# Patient Record
Sex: Female | Born: 1990
Health system: Southern US, Community
[De-identification: ages and names within clinical notes are randomized; demographics above are authoritative.]

## PROBLEM LIST (undated history)

## (undated) DIAGNOSIS — IMO0001 Reserved for inherently not codable concepts without codable children: Secondary | ICD-10-CM

## (undated) DIAGNOSIS — F32A Depression, unspecified: Secondary | ICD-10-CM

## (undated) DIAGNOSIS — F988 Other specified behavioral and emotional disorders with onset usually occurring in childhood and adolescence: Secondary | ICD-10-CM

## (undated) DIAGNOSIS — F909 Attention-deficit hyperactivity disorder, unspecified type: Secondary | ICD-10-CM

---
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
## 2012-08-30 ENCOUNTER — Ambulatory Visit: Payer: Self-pay

## 2012-08-30 ENCOUNTER — Other Ambulatory Visit: Payer: Self-pay | Admitting: Occupational Medicine

## 2012-08-30 DIAGNOSIS — R7612 Nonspecific reaction to cell mediated immunity measurement of gamma interferon antigen response without active tuberculosis: Secondary | ICD-10-CM

## 2013-01-26 ENCOUNTER — Emergency Department (HOSPITAL_COMMUNITY)
Admission: EM | Admit: 2013-01-26 | Discharge: 2013-01-26 | Disposition: A | Discharge status: Home / Self Care | Payer: 59 | Source: Home / Self Care | Attending: Emergency Medicine | Admitting: Emergency Medicine

## 2013-01-26 ENCOUNTER — Encounter (HOSPITAL_COMMUNITY): Payer: Self-pay | Admitting: *Deleted

## 2013-01-26 DIAGNOSIS — K1120 Sialoadenitis, unspecified: Secondary | ICD-10-CM

## 2013-01-26 MED ORDER — AMOXICILLIN-POT CLAVULANATE 875-125 MG PO TABS: 1.0000 | ORAL_TABLET | Freq: Two times a day (BID) | ORAL | Status: DC

## 2013-01-26 NOTE — ED Notes (Signed)
Pt  Has  Red  Swollen  Area  Under  Tongue  Which  He  Noticed  This  Am    -  He  Says  The  Area  Ins  Not  painfull   -  It  Is  Slightly  reddned  However

## 2013-01-26 NOTE — ED Provider Notes (Signed)
Chief Complaint:   Chief Complaint  Patient presents with  . Oral Swelling    History of Present Illness:   Andrea Sparks is a 22 year old female who has had a swelling to the right of the frenulum below his tongue starting today. Denies any trauma to the area. It's been red and swollen. It's not painful. He has no difficulty eating or swallowing. He denies any swelling in the neck or fever.  Review of Systems:  Other than noted above, the patient denies any of the following symptoms: Systemic:  No fevers, chills, sweats, weight loss or gain, fatigue, or tiredness. Eye:  No redness, pain, discharge, itching, blurred vision, or diplopia. ENT:  No headache, nasal congestion, sneezing, itching, epistaxis, ear pain, congestion, decreased hearing, ringing in ears, vertigo, or tinnitus.  No oral lesions, sore throat, pain on swallowing, or hoarseness. Neck:  No mass, tenderness or adenopathy. Lungs:  No coughing, wheezing, or shortness of breath. Skin:  No rash or itching.  PMFSH:  Past medical history, family history, social history, meds, and allergies were reviewed.   Physical Exam:   Vital signs:  BP 127/76  Pulse 65  Temp(Src) 98.8 F (37.1 C) (Oral)  Resp 17  SpO2 97% General:  Alert and oriented.  In no distress.  Skin warm and dry. Eye:  PERRL, full EOMs, lids and conjunctiva normal.   ENT:  TMs and canals clear.  Nasal mucosa not congested and without drainage.  Mucous membranes moist, no oral lesions, normal dentition, pharynx clear.  No cranial or facial pain to palplation. Wharton's duct, below the tongue, and on the right was swollen but nontender. There was no palpable stone. Neck:  Supple, full ROM.  No adenopathy, tenderness or mass.  Thyroid normal. Lungs:  Breath sounds clear and equal bilaterally.  No wheezes, rales or rhonchi. Heart:  Rhythm regular, without extrasystoles.  No gallops or murmers. Skin:  Clear, warm and dry.  Assessment:  The encounter diagnosis was  Sialadenitis.  This could be due to just inflammation or could be a stone.  Plan:   1.  The following meds were prescribed:   Discharge Medication List as of 01/26/2013  5:14 PM    START taking these medications   Details  amoxicillin-clavulanate (AUGMENTIN) 875-125 MG per tablet Take 1 tablet by mouth 2 (two) times daily., Starting 01/26/2013, Until Discontinued, Normal       2.  The patient was instructed in symptomatic care and handouts were given. Suggested hot saline mouthwash, maintaining hydration, and sore limit drops. 3.  The patient was told to return if becoming worse in any way, if no better in 3 or 4 days, and given some red flag symptoms such as worsening of the pain that would indicate earlier return. 4.  Follow up with Dr. Suszanne Conners if no better in one week.    Reuben Likes, MD 01/26/13 [phone]

## 2016-05-20 ENCOUNTER — Ambulatory Visit (INDEPENDENT_AMBULATORY_CARE_PROVIDER_SITE_OTHER): Payer: BLUE CROSS/BLUE SHIELD | Admitting: Family Medicine

## 2016-05-20 VITALS — BP 138/84 | HR 100 | Temp 99.0°F | Resp 16 | Ht 73.0 in | Wt 176.8 lb

## 2016-05-20 DIAGNOSIS — J069 Acute upper respiratory infection, unspecified: Secondary | ICD-10-CM

## 2016-05-20 NOTE — Progress Notes (Signed)
  Chief Complaint  Patient presents with  . Cough    x 3 days  . Chills  . Sore Throat    HPI Pt reports that since 05/16/16 he has been having sore throat, chills and cough for 3 days He states that his cough is painful and nonproductive He has nasal congestion He denies runny nose, dizziness, nausea or vomiting. He states that his boss had a bacterial infection.   No past medical history on file.  No current outpatient prescriptions on file.   No current facility-administered medications for this visit.     Allergies: No Known Allergies  No past surgical history on file.  Social History   Social History  . Marital status: Single    Spouse name: N/A  . Number of children: N/A  . Years of education: N/A   Social History Main Topics  . Smoking status: Never Smoker  . Smokeless tobacco: None  . Alcohol use No  . Drug use: Unknown  . Sexual activity: Not Asked   Other Topics Concern  . None   Social History Narrative  . None    ROS  Objective: Vitals:   05/20/16 1118  BP: 138/84  Pulse: 100  Resp: 16  Temp: 99 F (37.2 C)  TempSrc: Oral  SpO2: 99%  Weight: 176 lb 12.8 oz (80.2 kg)  Height: 6\' 1"  (1.854 m)    Physical Exam General: alert, oriented, in NAD Head: normocephalic, atraumatic, no sinus tenderness Eyes: EOM intact, no scleral icterus or conjunctival injection Ears: TM clear bilaterally Throat: no pharyngeal exudate or erythema Lymph: no posterior auricular, submental or cervical lymph adenopathy Heart: normal rate, normal sinus rhythm, no murmurs Lungs: clear to auscultation bilaterally, no wheezing   Assessment and Plan Casimiro NeedleMichael was seen today for cough, chills and sore throat.  Diagnoses and all orders for this visit:  Acute URI- supportive care with lozenges, hydration and antihistamine     Zoe A Creta LevinStallings

## 2016-05-20 NOTE — Patient Instructions (Addendum)
   IF you received an x-ray today, you will receive an invoice from [facility]. Please contact [facility] at [phone] with questions or concerns regarding your invoice.   IF you received labwork today, you will receive an invoice from [facility]. Please contact [facility] at [phone] with questions or concerns regarding your invoice.   Our billing staff will not be able to assist you with questions regarding bills from these companies.  You will be contacted with the lab results as soon as they are available. The fastest way to get your results is to activate your My Chart account. Instructions are located on the last page of this paperwork. If you have not heard from us regarding the results in 2 weeks, please contact this office.     Viral Respiratory Infection A respiratory infection is an illness that affects part of the respiratory system, such as the lungs, nose, or throat. Most respiratory infections are caused by either viruses or bacteria. A respiratory infection that is caused by a virus is called a viral respiratory infection. Common types of viral respiratory infections include:  A cold.  The flu (influenza).  A respiratory syncytial virus (RSV) infection.  How do I know if I have a viral respiratory infection? Most viral respiratory infections cause:  A stuffy or runny nose.  Yellow or green nasal discharge.  A cough.  Sneezing.  Fatigue.  Achy muscles.  A sore throat.  Sweating or chills.  A fever.  A headache.  How are viral respiratory infections treated? If influenza is diagnosed early, it may be treated with an antiviral medicine that shortens the length of time a person has symptoms. Symptoms of viral respiratory infections may be treated with over-the-counter and prescription medicines, such as:  Expectorants. These make it easier to cough up mucus.  Decongestant nasal sprays.  Health care providers do not prescribe  antibiotic medicines for viral infections. This is because antibiotics are designed to kill bacteria. They have no effect on viruses. How do I know if I should stay home from work or school? To avoid exposing others to your respiratory infection, stay home if you have:  A fever.  A persistent cough.  A sore throat.  A runny nose.  Sneezing.  Muscles aches.  Headaches.  Fatigue.  Weakness.  Chills.  Sweating.  Nausea.  Follow these instructions at home:  Rest as much as possible.  Take over-the-counter and prescription medicines only as told by your health care provider.  Drink enough fluid to keep your urine clear or pale yellow. This helps prevent dehydration and helps loosen up mucus.  Gargle with a salt-water mixture 3-4 times per day or as needed. To make a salt-water mixture, completely dissolve -1 tsp of salt in 1 cup of warm water.  Use nose drops made from salt water to ease congestion and soften raw skin around your nose.  Do not drink alcohol.  Do not use tobacco products, including cigarettes, chewing tobacco, and e-cigarettes. If you need help quitting, ask your health care provider. Contact a health care provider if:  Your symptoms last for 10 days or longer.  Your symptoms get worse over time.  You have a fever.  You have severe sinus pain in your face or forehead.  The glands in your jaw or neck become very swollen. Get help right away if:  You feel pain or pressure in your chest.  You have shortness of breath.  You faint or feel like you   will faint.  You have severe and persistent vomiting.  You feel confused or disoriented. This information is not intended to replace advice given to you by your health care provider. Make sure you discuss any questions you have with your health care provider. Document Released: 02/19/2005 Document Revised: 10/18/2015 Document Reviewed: 10/18/2014 Elsevier Interactive Patient Education  2017 Elsevier  Inc.  

## 2017-05-05 DIAGNOSIS — F649 Gender identity disorder, unspecified: Secondary | ICD-10-CM | POA: Insufficient documentation

## 2017-05-05 DIAGNOSIS — E349 Endocrine disorder, unspecified: Secondary | ICD-10-CM | POA: Insufficient documentation

## 2017-07-10 ENCOUNTER — Ambulatory Visit (HOSPITAL_COMMUNITY): Admission: EM | Admit: 2017-07-10 | Discharge: 2017-07-10 | Discharge status: Against Medical Advice | Payer: 59

## 2017-07-10 NOTE — ED Notes (Signed)
Per pt access pt left 

## 2017-09-08 DIAGNOSIS — F4322 Adjustment disorder with anxiety: Secondary | ICD-10-CM | POA: Diagnosis not present

## 2017-09-22 DIAGNOSIS — F4322 Adjustment disorder with anxiety: Secondary | ICD-10-CM | POA: Diagnosis not present

## 2017-11-10 DIAGNOSIS — F4322 Adjustment disorder with anxiety: Secondary | ICD-10-CM | POA: Diagnosis not present

## 2017-12-11 DIAGNOSIS — E349 Endocrine disorder, unspecified: Secondary | ICD-10-CM | POA: Diagnosis not present

## 2017-12-11 DIAGNOSIS — F649 Gender identity disorder, unspecified: Secondary | ICD-10-CM | POA: Diagnosis not present

## 2017-12-16 DIAGNOSIS — F4322 Adjustment disorder with anxiety: Secondary | ICD-10-CM | POA: Diagnosis not present

## 2017-12-29 DIAGNOSIS — F4322 Adjustment disorder with anxiety: Secondary | ICD-10-CM | POA: Diagnosis not present

## 2018-01-28 DIAGNOSIS — F4322 Adjustment disorder with anxiety: Secondary | ICD-10-CM | POA: Diagnosis not present

## 2018-03-12 DIAGNOSIS — F902 Attention-deficit hyperactivity disorder, combined type: Secondary | ICD-10-CM | POA: Diagnosis not present

## 2018-04-09 DIAGNOSIS — F902 Attention-deficit hyperactivity disorder, combined type: Secondary | ICD-10-CM | POA: Diagnosis not present

## 2018-04-13 DIAGNOSIS — E349 Endocrine disorder, unspecified: Secondary | ICD-10-CM | POA: Diagnosis not present

## 2018-04-13 DIAGNOSIS — F649 Gender identity disorder, unspecified: Secondary | ICD-10-CM | POA: Diagnosis not present

## 2018-04-13 DIAGNOSIS — Z23 Encounter for immunization: Secondary | ICD-10-CM | POA: Diagnosis not present

## 2018-04-30 DIAGNOSIS — F902 Attention-deficit hyperactivity disorder, combined type: Secondary | ICD-10-CM | POA: Diagnosis not present

## 2018-06-11 DIAGNOSIS — Z202 Contact with and (suspected) exposure to infections with a predominantly sexual mode of transmission: Secondary | ICD-10-CM | POA: Diagnosis not present

## 2018-06-11 DIAGNOSIS — Z23 Encounter for immunization: Secondary | ICD-10-CM | POA: Diagnosis not present

## 2019-04-29 DIAGNOSIS — F4322 Adjustment disorder with anxiety: Secondary | ICD-10-CM | POA: Diagnosis not present

## 2019-05-06 ENCOUNTER — Other Ambulatory Visit: Payer: Self-pay

## 2019-05-06 ENCOUNTER — Telehealth (INDEPENDENT_AMBULATORY_CARE_PROVIDER_SITE_OTHER): Payer: BC Managed Care – PPO | Admitting: Family Medicine

## 2019-05-06 DIAGNOSIS — K0889 Other specified disorders of teeth and supporting structures: Secondary | ICD-10-CM | POA: Diagnosis not present

## 2019-05-06 DIAGNOSIS — J029 Acute pharyngitis, unspecified: Secondary | ICD-10-CM | POA: Diagnosis not present

## 2019-05-06 MED ORDER — AMOXICILLIN 500 MG PO CAPS: 500.0000 mg | ORAL_CAPSULE | Freq: Three times a day (TID) | ORAL | 0 refills | Status: DC

## 2019-05-06 NOTE — Progress Notes (Signed)
   Virtual Visit Note  I connected with patient on 05/06/19 at 556pm by phone and verified that I am speaking with the correct person using two identifiers. Andrea Sparks is currently located at home and patient is currently with them during visit. The provider, Rutherford Guys, MD is located in their office at time of visit.  I discussed the limitations, risks, security and privacy concerns of performing an evaluation and management service by telephone and the availability of in person appointments. I also discussed with the patient that there may be a patient responsible charge related to this service. The patient expressed understanding and agreed to proceed.   CC: sore throat  HPI ? Feels that he has a small bump on right tonsil Hurts when he swallows In the past had similar sx preceded fevers and infection When he looks in the mirror tonsil is not red or swollen Has mild cervical LAD No ear pain Has a right lower broken molar - has been achy on and off for the past, waiting for dental insurance to start next week   No Known Allergies  Prior to Admission medications   Medication Sig Start Date End Date Taking? Authorizing Provider  buPROPion (WELLBUTRIN XL) 150 MG 24 hr tablet TAKE 1 TABLET BY MOUTH ONCE DAILY IN THE MORNING 06/08/18  Yes [provider]  estradiol valerate (DELESTROGEN) 20 MG/ML injection Inject into the muscle. 08/03/18 08/03/19 Yes [provider]  methylphenidate (RITALIN) 10 MG tablet TAKE 1 TABLET BY MOUTH TWICE DAILY 06/04/18  Yes [provider]  progesterone (PROMETRIUM) 100 MG capsule Take by mouth. 08/03/18 08/03/19 Yes [provider]    No past medical history on file.  No past surgical history on file.  Social History   Tobacco Use  . Smoking status: Never Smoker  Substance Use Topics  . Alcohol use: No    No family history on file.  ROS  Per hpi  Objective  Vitals as reported by the patient:  none   ASSESSMENT and PLAN  1. Sore throat 2. Pain, dental  Other orders - amoxicillin (AMOXIL) 500 MG capsule; Take 1 capsule (500 mg total) by mouth 3 (three) times daily.  FOLLOW-UP: prn   The above assessment and management plan was discussed with the patient. The patient verbalized understanding of and has agreed to the management plan. Patient is aware to call the clinic if symptoms persist or worsen. Patient is aware when to return to the clinic for a follow-up visit. Patient educated on when it is appropriate to go to the emergency department.    I provided 10 minutes of non-face-to-face time during this encounter.  Rutherford Guys, MD [facility] [place], [place] [address] Ph.  [phone] Fax [phone]

## 2019-05-06 NOTE — Progress Notes (Signed)
Pt says that her tonsil on the right side is swollen. Says there is a tickley feeling when swallowing. Feels there is a 2-3cm lump. Onset 24 hours ago, not using anything for the comfort of the throat. Says same thing happened in Feb accompanied with fever

## 2019-05-27 HISTORY — PX: MULTIPLE TOOTH EXTRACTIONS: SHX2053

## 2019-05-28 DIAGNOSIS — F4322 Adjustment disorder with anxiety: Secondary | ICD-10-CM | POA: Diagnosis not present

## 2019-06-11 DIAGNOSIS — F4322 Adjustment disorder with anxiety: Secondary | ICD-10-CM | POA: Diagnosis not present

## 2019-07-12 ENCOUNTER — Ambulatory Visit (INDEPENDENT_AMBULATORY_CARE_PROVIDER_SITE_OTHER): Payer: BC Managed Care – PPO

## 2019-07-12 ENCOUNTER — Ambulatory Visit (INDEPENDENT_AMBULATORY_CARE_PROVIDER_SITE_OTHER): Payer: BC Managed Care – PPO | Admitting: Registered Nurse

## 2019-07-12 ENCOUNTER — Encounter: Payer: Self-pay | Admitting: Registered Nurse

## 2019-07-12 ENCOUNTER — Other Ambulatory Visit: Payer: Self-pay

## 2019-07-12 VITALS — BP 137/85 | HR 84 | Temp 98.1°F | Ht 71.0 in | Wt 183.4 lb

## 2019-07-12 DIAGNOSIS — Z23 Encounter for immunization: Secondary | ICD-10-CM

## 2019-07-12 DIAGNOSIS — M79642 Pain in left hand: Secondary | ICD-10-CM | POA: Diagnosis not present

## 2019-07-12 DIAGNOSIS — M79645 Pain in left finger(s): Secondary | ICD-10-CM

## 2019-07-12 MED ORDER — DOXYCYCLINE HYCLATE 100 MG PO TABS: 100.0000 mg | ORAL_TABLET | Freq: Two times a day (BID) | ORAL | 0 refills | Status: DC

## 2019-07-12 NOTE — Progress Notes (Signed)
Established Patient Office Visit  Subjective:  Patient ID: Andrea Sparks, female    DOB: 1991/05/17  Age: 29 y.o. MRN: [id number]  CC:  Chief Complaint  Patient presents with  . Hand Pain    patient states he has pain in the right pointer finger. per patient he states it feels like he just got his finger pricked and shes it keeps getting worse . Hurts to touch and its throbbing.    HPI Andrea Sparks presents for pain on finger. Noticed either yesterday or two days ago - did not think much of it. Woke up today, finger is red, extremely tender. Second digit on L hand. No known injury. Uses this finger frequently for typing and work, but has not had this issue before.   Of note, pt has microchips implanted in the webbing between the first and second digits of each hand. Inserted around 4 months ago without incident. Incisions appear to be healing well.   No past medical history on file.  No past surgical history on file.  No family history on file.  Social History   Socioeconomic History  . Marital status: Single    Spouse name: Not on file  . Number of children: Not on file  . Years of education: Not on file  . Highest education level: Not on file  Occupational History  . Not on file  Tobacco Use  . Smoking status: Never Smoker  . Smokeless tobacco: Never Used  Substance and Sexual Activity  . Alcohol use: No  . Drug use: Never  . Sexual activity: Yes  Other Topics Concern  . Not on file  Social History Narrative  . Not on file   Social Determinants of Health   Financial Resource Strain:   . Difficulty of Paying Living Expenses: Not on file  Food Insecurity:   . Worried About Charity fundraiser in the Last Year: Not on file  . Ran Out of Food in the Last Year: Not on file  Transportation Needs:   . Lack of Transportation (Medical): Not on file  . Lack of Transportation (Non-Medical): Not on file  Physical Activity:   . Days of Exercise per Week: Not on  file  . Minutes of Exercise per Session: Not on file  Stress:   . Feeling of Stress : Not on file  Social Connections:   . Frequency of Communication with Friends and Family: Not on file  . Frequency of Social Gatherings with Friends and Family: Not on file  . Attends Religious Services: Not on file  . Active Member of Clubs or Organizations: Not on file  . Attends Archivist Meetings: Not on file  . Marital Status: Not on file  Intimate Partner Violence:   . Fear of Current or Ex-Partner: Not on file  . Emotionally Abused: Not on file  . Physically Abused: Not on file  . Sexually Abused: Not on file    Outpatient Medications Prior to Visit  Medication Sig Dispense Refill  . buPROPion (WELLBUTRIN XL) 150 MG 24 hr tablet TAKE 1 TABLET BY MOUTH ONCE DAILY IN THE MORNING    . estradiol valerate (DELESTROGEN) 20 MG/ML injection Inject into the muscle.    . methylphenidate (RITALIN) 10 MG tablet TAKE 1 TABLET BY MOUTH TWICE DAILY    . progesterone (PROMETRIUM) 100 MG capsule Take by mouth.    Marland Kitchen amoxicillin (AMOXIL) 500 MG capsule Take 1 capsule (500 mg total) by mouth 3 (  three) times daily. (Patient not taking: Reported on 07/12/2019) 30 capsule 0   No facility-administered medications prior to visit.    No Known Allergies  ROS Review of Systems  Constitutional: Negative.   HENT: Negative.   Eyes: Negative.   Respiratory: Negative.   Cardiovascular: Negative.   Gastrointestinal: Negative.   Endocrine: Negative.   Genitourinary: Negative.   Musculoskeletal: Negative.   Skin: Negative.   Allergic/Immunologic: Negative.   Neurological: Negative.   Hematological: Negative.   Psychiatric/Behavioral: Negative.   All other systems reviewed and are negative.     Objective:    Physical Exam  Constitutional: She is oriented to person, place, and time. She appears well-developed and well-nourished. No distress.  HENT:  Head: Normocephalic and atraumatic.  Right Ear:  External ear normal.  Left Ear: External ear normal.  Mouth/Throat: No oropharyngeal exudate.  Eyes: Conjunctivae are normal.  Cardiovascular: Normal rate and regular rhythm.  Neurological: She is alert and oriented to person, place, and time. No cranial nerve deficit. She exhibits normal muscle tone. Coordination normal.  Skin: Skin is warm and dry. No rash noted. She is not diaphoretic. There is erythema. No pallor.     Psychiatric: She has a normal mood and affect. Her behavior is normal. Judgment and thought content normal.  Nursing note and vitals reviewed.   BP 137/85   Pulse 84   Temp 98.1 F (36.7 C) (Temporal)   Ht 5' 11"  (1.803 m)   Wt 183 lb 6.4 oz (83.2 kg)   SpO2 98%   BMI 25.58 kg/m  Wt Readings from Last 3 Encounters:  07/12/19 183 lb 6.4 oz (83.2 kg)  05/20/16 176 lb 12.8 oz (80.2 kg)     Health Maintenance Due  Topic Date Due  . HIV Screening  05/17/2006  . PAP-Cervical Cytology Screening  05/17/2012  . PAP SMEAR-Modifier  05/17/2012  . INFLUENZA VACCINE  12/25/2018    There are no preventive care reminders to display for this patient.  No results found for: TSH No results found for: WBC, HGB, HCT, MCV, PLT No results found for: NA, K, CHLORIDE, CO2, GLUCOSE, BUN, CREATININE, BILITOT, ALKPHOS, AST, ALT, PROT, ALBUMIN, CALCIUM, ANIONGAP, EGFR, GFR No results found for: CHOL No results found for: HDL No results found for: LDLCALC No results found for: TRIG No results found for: CHOLHDL No results found for: HGBA1C    Assessment & Plan:   Problem List Items Addressed This Visit    None    Visit Diagnoses    Need for prophylactic vaccination and inoculation against influenza    -  Primary   Relevant Orders   Flu Vaccine QUAD 36+ mos IM   Finger pain, left       Relevant Medications   doxycycline (VIBRA-TABS) 100 MG tablet   Other Relevant Orders   DG Hand Complete Left (Completed)   Ambulatory referral to Hand Surgery      Meds ordered  this encounter  Medications  . doxycycline (VIBRA-TABS) 100 MG tablet    Sig: Take 1 tablet (100 mg total) by mouth 2 (two) times daily.    Dispense:  10 tablet    Refill:  0    Order Specific Question:   Supervising Provider    Answer:   Forrest Moron [id number]    Follow-up: No follow-ups on file.   PLAN  No sign of foreign body in finger on xray, but suspect sliver of unknown origin providing irritation. Suggest soaks, OTC analgesic,  short course of abx to prevent infection.  Microchips noted on xray likely not related to patient's symptoms, but will refer to hand surgeon for further assessment if symptoms fail to resolve  Patient encouraged to call clinic with any questions, comments, or concerns.  Maximiano Coss, NP

## 2019-07-12 NOTE — Patient Instructions (Signed)
° ° ° °  If you have lab work done today you will be contacted with your lab results within the next 2 weeks.  If you have not heard from us then please contact us. The fastest way to get your results is to register for My Chart. ° ° °IF you received an x-ray today, you will receive an invoice from [facility]. Please contact [facility] at [phone] with questions or concerns regarding your invoice.  ° °IF you received labwork today, you will receive an invoice from [facility]. Please contact [facility] at [phone] with questions or concerns regarding your invoice.  ° °Our billing staff will not be able to assist you with questions regarding bills from these companies. ° °You will be contacted with the lab results as soon as they are available. The fastest way to get your results is to activate your My Chart account. Instructions are located on the last page of this paperwork. If you have not heard from us regarding the results in 2 weeks, please contact this office. °  ° ° ° °

## 2019-07-15 DIAGNOSIS — F4322 Adjustment disorder with anxiety: Secondary | ICD-10-CM | POA: Diagnosis not present

## 2019-07-29 DIAGNOSIS — F4322 Adjustment disorder with anxiety: Secondary | ICD-10-CM | POA: Diagnosis not present

## 2019-08-05 ENCOUNTER — Encounter: Payer: Self-pay | Admitting: Registered Nurse

## 2019-08-05 ENCOUNTER — Other Ambulatory Visit: Payer: Self-pay

## 2019-08-05 ENCOUNTER — Ambulatory Visit (INDEPENDENT_AMBULATORY_CARE_PROVIDER_SITE_OTHER): Payer: BC Managed Care – PPO | Admitting: Registered Nurse

## 2019-08-05 VITALS — BP 141/85 | HR 83 | Temp 98.0°F | Ht 71.0 in | Wt 184.2 lb

## 2019-08-05 DIAGNOSIS — Z13228 Encounter for screening for other metabolic disorders: Secondary | ICD-10-CM

## 2019-08-05 DIAGNOSIS — Z1329 Encounter for screening for other suspected endocrine disorder: Secondary | ICD-10-CM | POA: Diagnosis not present

## 2019-08-05 DIAGNOSIS — Z13220 Encounter for screening for lipoid disorders: Secondary | ICD-10-CM

## 2019-08-05 DIAGNOSIS — Z0000 Encounter for general adult medical examination without abnormal findings: Secondary | ICD-10-CM | POA: Diagnosis not present

## 2019-08-05 DIAGNOSIS — Z130 Encounter for screening for diseases of the blood and blood-forming organs and certain disorders involving the immune mechanism: Secondary | ICD-10-CM

## 2019-08-05 DIAGNOSIS — F640 Transsexualism: Secondary | ICD-10-CM | POA: Diagnosis not present

## 2019-08-05 DIAGNOSIS — Z789 Other specified health status: Secondary | ICD-10-CM

## 2019-08-05 NOTE — Patient Instructions (Signed)
° ° ° °  If you have lab work done today you will be contacted with your lab results within the next 2 weeks.  If you have not heard from us then please contact us. The fastest way to get your results is to register for My Chart. ° ° °IF you received an x-ray today, you will receive an invoice from [facility]. Please contact  Radiology at [phone] with questions or concerns regarding your invoice.  ° °IF you received labwork today, you will receive an invoice from [facility]. Please contact [facility] at [phone] with questions or concerns regarding your invoice.  ° °Our billing staff will not be able to assist you with questions regarding bills from these companies. ° °You will be contacted with the lab results as soon as they are available. The fastest way to get your results is to activate your My Chart account. Instructions are located on the last page of this paperwork. If you have not heard from us regarding the results in 2 weeks, please contact this office. °  ° ° ° °

## 2019-08-06 LAB — LIPID PANEL
Chol/HDL Ratio: 3.3 ratio (ref 0.0–4.4)
Cholesterol, Total: 156 mg/dL (ref 100–199)
HDL: 47 mg/dL (ref >39)
LDL Chol Calc (NIH): 88 mg/dL (ref 0–99)
Triglycerides: 117 mg/dL (ref 0–149)
VLDL Cholesterol Cal: 21 mg/dL (ref 5–40)

## 2019-08-06 LAB — COMPREHENSIVE METABOLIC PANEL
ALT: 16 IU/L (ref 0–32)
AST: 18 IU/L (ref 0–40)
Albumin/Globulin Ratio: 1.7 (ref 1.2–2.2)
Albumin: 4.7 g/dL (ref 3.9–5.0)
Alkaline Phosphatase: 34 IU/L — ABNORMAL LOW (ref 39–117)
BUN/Creatinine Ratio: 13 (ref 9–23)
BUN: 10 mg/dL (ref 6–20)
Bilirubin Total: 0.3 mg/dL (ref 0.0–1.2)
CO2: 20 mmol/L (ref 20–29)
Calcium: 9.5 mg/dL (ref 8.7–10.2)
Chloride: 105 mmol/L (ref 96–106)
Creatinine, Ser: 0.78 mg/dL (ref 0.57–1.00)
GFR calc Af Amer: 120 mL/min/{1.73_m2} (ref >59)
GFR calc non Af Amer: 104 mL/min/{1.73_m2} (ref >59)
Globulin, Total: 2.7 g/dL (ref 1.5–4.5)
Glucose: 82 mg/dL (ref 65–99)
Potassium: 4.1 mmol/L (ref 3.5–5.2)
Sodium: 143 mmol/L (ref 134–144)
Total Protein: 7.4 g/dL (ref 6.0–8.5)

## 2019-08-06 LAB — CBC WITH DIFFERENTIAL/PLATELET
Basophils Absolute: 0.1 10*3/uL (ref 0.0–0.2)
Basos: 1 %
EOS (ABSOLUTE): 0.1 10*3/uL (ref 0.0–0.4)
Eos: 2 %
Hematocrit: 40.2 % (ref 34.0–46.6)
Hemoglobin: 13.9 g/dL (ref 11.1–15.9)
Immature Grans (Abs): 0 10*3/uL (ref 0.0–0.1)
Immature Granulocytes: 0 %
Lymphocytes Absolute: 2.2 10*3/uL (ref 0.7–3.1)
Lymphs: 30 %
MCH: 29.8 pg (ref 26.6–33.0)
MCHC: 34.6 g/dL (ref 31.5–35.7)
MCV: 86 fL (ref 79–97)
Monocytes Absolute: 0.7 10*3/uL (ref 0.1–0.9)
Monocytes: 10 %
Neutrophils Absolute: 4.1 10*3/uL (ref 1.4–7.0)
Neutrophils: 57 %
Platelets: 292 10*3/uL (ref 150–450)
RBC: 4.67 x10E6/uL (ref 3.77–5.28)
RDW: 11.6 % — ABNORMAL LOW (ref 11.7–15.4)
WBC: 7.2 10*3/uL (ref 3.4–10.8)

## 2019-08-06 LAB — HEMOGLOBIN A1C
Est. average glucose Bld gHb Est-mCnc: 103 mg/dL
Hgb A1c MFr Bld: 5.2 % (ref 4.8–5.6)

## 2019-08-06 LAB — ESTRADIOL: Estradiol: 282 pg/mL

## 2019-08-06 LAB — TSH: TSH: 2.8 u[IU]/mL (ref 0.450–4.500)

## 2019-08-08 ENCOUNTER — Encounter: Payer: Self-pay | Admitting: Registered Nurse

## 2019-08-11 LAB — TESTOSTERONE: Testosterone: 19 ng/dL (ref 8–48)

## 2019-08-11 LAB — ESTROGENS, TOTAL

## 2019-08-12 DIAGNOSIS — F4322 Adjustment disorder with anxiety: Secondary | ICD-10-CM | POA: Diagnosis not present

## 2019-08-14 ENCOUNTER — Encounter: Payer: Self-pay | Admitting: Registered Nurse

## 2019-08-14 NOTE — Progress Notes (Signed)
Established Patient Office Visit  Subjective:  Patient ID: Andrea Sparks, female    DOB: [birth year]  Age: 29 y.o. MRN: [id number]  CC:  Chief Complaint  Patient presents with  . Transitions Of Care    establish care. physical    HPI Andrea Sparks presents for CPE and labs. On HRT, requesting hormone labs  No concerns at this time - finger pain from past visit resolved without issue.   HRT managed by provider in [facility] - really likes the provider, does not want to change. Given Dr. Raul Del information in case.  No past medical history on file.  No past surgical history on file.  No family history on file.  Social History   Socioeconomic History  . Marital status: Single    Spouse name: Not on file  . Number of children: Not on file  . Years of education: Not on file  . Highest education level: Not on file  Occupational History  . Not on file  Tobacco Use  . Smoking status: Never Smoker  . Smokeless tobacco: Never Used  Substance and Sexual Activity  . Alcohol use: No  . Drug use: Never  . Sexual activity: Yes  Other Topics Concern  . Not on file  Social History Narrative  . Not on file   Social Determinants of Health   Financial Resource Strain:   . Difficulty of Paying Living Expenses:   Food Insecurity:   . Worried About Charity fundraiser in the Last Year:   . Arboriculturist in the Last Year:   Transportation Needs:   . Film/video editor (Medical):   Marland Kitchen Lack of Transportation (Non-Medical):   Physical Activity:   . Days of Exercise per Week:   . Minutes of Exercise per Session:   Stress:   . Feeling of Stress :   Social Connections:   . Frequency of Communication with Friends and Family:   . Frequency of Social Gatherings with Friends and Family:   . Attends Religious Services:   . Active Member of Clubs or Organizations:   . Attends Archivist Meetings:   Marland Kitchen Marital Status:   Intimate Partner Violence:   . Fear of  Current or Ex-Partner:   . Emotionally Abused:   Marland Kitchen Physically Abused:   . Sexually Abused:     Outpatient Medications Prior to Visit  Medication Sig Dispense Refill  . buPROPion (WELLBUTRIN XL) 150 MG 24 hr tablet TAKE 1 TABLET BY MOUTH ONCE DAILY IN THE MORNING    . methylphenidate (RITALIN) 10 MG tablet TAKE 1 TABLET BY MOUTH TWICE DAILY    . amoxicillin (AMOXIL) 500 MG capsule Take 1 capsule (500 mg total) by mouth 3 (three) times daily. (Patient not taking: Reported on 07/12/2019) 30 capsule 0  . doxycycline (VIBRA-TABS) 100 MG tablet Take 1 tablet (100 mg total) by mouth 2 (two) times daily. (Patient not taking: Reported on 08/05/2019) 10 tablet 0  . estradiol valerate (DELESTROGEN) 20 MG/ML injection Inject into the muscle.    . progesterone (PROMETRIUM) 100 MG capsule Take by mouth.     No facility-administered medications prior to visit.    No Known Allergies  ROS Review of Systems  Constitutional: Negative.   HENT: Negative.   Eyes: Negative.   Respiratory: Negative.   Cardiovascular: Negative.   Gastrointestinal: Negative.   Endocrine: Negative.   Genitourinary: Negative.   Musculoskeletal: Negative.   Skin: Negative.   Allergic/Immunologic: Negative.  Neurological: Negative.   Hematological: Negative.   Psychiatric/Behavioral: Negative.   All other systems reviewed and are negative.     Objective:    Physical Exam  Constitutional: She is oriented to person, place, and time. She appears well-developed and well-nourished. No distress.  HENT:  Head: Normocephalic and atraumatic.  Right Ear: External ear normal.  Left Ear: External ear normal.  Nose: Nose normal.  Mouth/Throat: Oropharynx is clear and moist. No oropharyngeal exudate.  Eyes: Pupils are equal, round, and reactive to light. Conjunctivae and EOM are normal. Right eye exhibits no discharge. Left eye exhibits no discharge. No scleral icterus.  Neck: No tracheal deviation present. No thyromegaly  present.  Cardiovascular: Normal rate, regular rhythm, normal heart sounds and intact distal pulses. Exam reveals no gallop and no friction rub.  No murmur heard. Pulmonary/Chest: Effort normal and breath sounds normal. No respiratory distress. She has no wheezes. She has no rales. She exhibits no tenderness.  Abdominal: Soft. Bowel sounds are normal. She exhibits no distension and no mass. There is no abdominal tenderness. There is no rebound and no guarding.  Musculoskeletal:        General: No tenderness, deformity or edema. Normal range of motion.     Cervical back: Normal range of motion and neck supple.  Lymphadenopathy:    She has no cervical adenopathy.  Neurological: She is alert and oriented to person, place, and time. No cranial nerve deficit. She exhibits normal muscle tone. Coordination normal.  Skin: Skin is warm. No rash noted. She is not diaphoretic. No erythema. No pallor.  Psychiatric: She has a normal mood and affect. Her behavior is normal. Judgment and thought content normal.  Nursing note and vitals reviewed.   BP (!) 141/85   Pulse 83   Temp 98 F (36.7 C) (Temporal)   Ht 5\' 11"  (1.803 m)   Wt 184 lb 3.2 oz (83.6 kg)   SpO2 98%   BMI 25.69 kg/m  Wt Readings from Last 3 Encounters:  08/05/19 184 lb 3.2 oz (83.6 kg)  07/12/19 183 lb 6.4 oz (83.2 kg)  05/20/16 176 lb 12.8 oz (80.2 kg)     Health Maintenance Due  Topic Date Due  . PAP-Cervical Cytology Screening  Never done  . PAP SMEAR-Modifier  Never done    There are no preventive care reminders to display for this patient.  Lab Results  Component Value Date   TSH 2.800 08/05/2019   Lab Results  Component Value Date   WBC 7.2 08/05/2019   HGB 13.9 08/05/2019   HCT 40.2 08/05/2019   MCV 86 08/05/2019   PLT 292 08/05/2019   Lab Results  Component Value Date   NA 143 08/05/2019   K 4.1 08/05/2019   CO2 20 08/05/2019   GLUCOSE 82 08/05/2019   BUN 10 08/05/2019   CREATININE 0.78 08/05/2019    BILITOT 0.3 08/05/2019   ALKPHOS 34 (L) 08/05/2019   AST 18 08/05/2019   ALT 16 08/05/2019   PROT 7.4 08/05/2019   ALBUMIN 4.7 08/05/2019   CALCIUM 9.5 08/05/2019   Lab Results  Component Value Date   CHOL 156 08/05/2019   Lab Results  Component Value Date   HDL 47 08/05/2019   Lab Results  Component Value Date   LDLCALC 88 08/05/2019   Lab Results  Component Value Date   TRIG 117 08/05/2019   Lab Results  Component Value Date   CHOLHDL 3.3 08/05/2019   Lab Results  Component Value  Date   HGBA1C 5.2 08/05/2019      Assessment & Plan:   Problem List Items Addressed This Visit    None    Visit Diagnoses    Screening for endocrine, metabolic and immunity disorder    -  Primary   Relevant Orders   CBC with Differential (Completed)   Comprehensive metabolic panel (Completed)   Hemoglobin A1c (Completed)   TSH (Completed)   Lipid screening       Relevant Orders   Lipid Panel (Completed)   Transgender       Relevant Orders   Testosterone (Completed)   Estrogens, Total (Completed)   Estradiol (Completed)      No orders of the defined types were placed in this encounter.   Follow-up: No follow-ups on file.   PLAN  Exam unremarkable  Labs drawn - will follow up as warranted  Return to clinic in 1 year for CPE and labs  Patient encouraged to call clinic with any questions, comments, or concerns.  Janeece Agee, NP

## 2019-09-09 DIAGNOSIS — E349 Endocrine disorder, unspecified: Secondary | ICD-10-CM | POA: Diagnosis not present

## 2019-12-16 DIAGNOSIS — F4322 Adjustment disorder with anxiety: Secondary | ICD-10-CM | POA: Diagnosis not present

## 2020-01-18 ENCOUNTER — Other Ambulatory Visit: Payer: Self-pay

## 2020-01-18 ENCOUNTER — Telehealth (INDEPENDENT_AMBULATORY_CARE_PROVIDER_SITE_OTHER): Payer: BC Managed Care – PPO | Admitting: Registered Nurse

## 2020-01-18 ENCOUNTER — Encounter: Payer: Self-pay | Admitting: Registered Nurse

## 2020-01-18 DIAGNOSIS — S39012A Strain of muscle, fascia and tendon of lower back, initial encounter: Secondary | ICD-10-CM

## 2020-01-18 MED ORDER — PREDNISONE 20 MG PO TABS: 40.0000 mg | ORAL_TABLET | Freq: Every day | ORAL | 0 refills | Status: DC

## 2020-01-18 MED ORDER — DICLOFENAC SODIUM 75 MG PO TBEC: 75.0000 mg | DELAYED_RELEASE_TABLET | Freq: Two times a day (BID) | ORAL | 0 refills | Status: DC

## 2020-01-18 MED ORDER — METHOCARBAMOL 500 MG PO TABS: 500.0000 mg | ORAL_TABLET | Freq: Four times a day (QID) | ORAL | 0 refills | Status: DC

## 2020-01-18 NOTE — Progress Notes (Signed)
Telemedicine Encounter- SOAP NOTE Established Patient  This telephone encounter was conducted with the patient's (or proxy's) verbal consent via audio telecommunications: yes  Patient was instructed to have this encounter in a suitably private space; and to only have persons present to whom they give permission to participate. In addition, patient identity was confirmed by use of name plus two identifiers (DOB and address).  I discussed the limitations, risks, security and privacy concerns of performing an evaluation and management service by telephone and the availability of in person appointments. I also discussed with the patient that there may be a patient responsible charge related to this service. The patient expressed understanding and agreed to proceed.  I spent a total of 13 minutes talking with the patient or their proxy.  Patient is at home Provider is in office  Chief Complaint  Patient presents with  . Back Pain    pulled muscle/ x 5days - out of work yesterday    Subjective   Andrea Sparks is a 29 y.o. established patient. Telephone visit today for muscle strain  HPI Onset 5 days ago Strain of lower back Started on Saturday when lifting a friend Pain is worse with straight leg raise No radiation down legs No headaches, blurred vision, LOC No saddle anesthesia or incontinence Pain is largely midline, r mildly worse than L Has happened before with lifting in past.  Patient Active Problem List   Diagnosis Date Noted  . Gender dysphoria 05/05/2017  . Hormone imbalance 05/05/2017    No past medical history on file.  Current Outpatient Medications  Medication Sig Dispense Refill  . buPROPion (WELLBUTRIN XL) 150 MG 24 hr tablet TAKE 1 TABLET BY MOUTH ONCE DAILY IN THE MORNING    . estradiol valerate (DELESTROGEN) 20 MG/ML injection Inject into the muscle.    . methylphenidate (RITALIN) 10 MG tablet TAKE 1 TABLET BY MOUTH TWICE DAILY    . progesterone  (PROMETRIUM) 100 MG capsule Take by mouth.     No current facility-administered medications for this visit.    No Known Allergies  Social History   Socioeconomic History  . Marital status: Single    Spouse name: Not on file  . Number of children: Not on file  . Years of education: Not on file  . Highest education level: Not on file  Occupational History  . Not on file  Tobacco Use  . Smoking status: Never Smoker  . Smokeless tobacco: Never Used  Substance and Sexual Activity  . Alcohol use: No  . Drug use: Never  . Sexual activity: Yes  Other Topics Concern  . Not on file  Social History Narrative  . Not on file   Social Determinants of Health   Financial Resource Strain:   . Difficulty of Paying Living Expenses: Not on file  Food Insecurity:   . Worried About Programme researcher, broadcasting/film/video in the Last Year: Not on file  . Ran Out of Food in the Last Year: Not on file  Transportation Needs:   . Lack of Transportation (Medical): Not on file  . Lack of Transportation (Non-Medical): Not on file  Physical Activity:   . Days of Exercise per Week: Not on file  . Minutes of Exercise per Session: Not on file  Stress:   . Feeling of Stress : Not on file  Social Connections:   . Frequency of Communication with Friends and Family: Not on file  . Frequency of Social Gatherings with Friends  and Family: Not on file  . Attends Religious Services: Not on file  . Active Member of Clubs or Organizations: Not on file  . Attends Banker Meetings: Not on file  . Marital Status: Not on file  Intimate Partner Violence:   . Fear of Current or Ex-Partner: Not on file  . Emotionally Abused: Not on file  . Physically Abused: Not on file  . Sexually Abused: Not on file    ROS Pertinent positives and negatives per hpi  Objective   Vitals as reported by the patient: There were no vitals filed for this visit.  [name] was seen today for back pain.  Diagnoses and all orders for  this visit:  Strain of lumbar region, initial encounter -     predniSONE (DELTASONE) 20 MG tablet; Take 2 tablets (40 mg total) by mouth daily with breakfast. -     diclofenac (VOLTAREN) 75 MG EC tablet; Take 1 tablet (75 mg total) by mouth 2 (two) times daily. -     methocarbamol (ROBAXIN) 500 MG tablet; Take 1 tablet (500 mg total) by mouth 4 (four) times daily.   PLAN  Sounds as muscle strain  Prednisone burst, diclofenac, and robaxin. Discussed using these with patient, including AEs and reasons to stop  Return to clinic in 1 week if worsening or no improvement  Patient encouraged to call clinic with any questions, comments, or concerns.  I discussed the assessment and treatment plan with the patient. The patient was provided an opportunity to ask questions and all were answered. The patient agreed with the plan and demonstrated an understanding of the instructions.   The patient was advised to call back or seek an in-person evaluation if the symptoms worsen or if the condition fails to improve as anticipated.  I provided 13 minutes of non-face-to-face time during this encounter.  Janeece Agee, NP  Primary Care at [facility]

## 2020-01-18 NOTE — Patient Instructions (Signed)
° ° ° °  If you have lab work done today you will be contacted with your lab results within the next 2 weeks.  If you have not heard from us then please contact us. The fastest way to get your results is to register for My Chart. ° ° °IF you received an x-ray today, you will receive an invoice from [facility]. Please contact [facility] at [phone] with questions or concerns regarding your invoice.  ° °IF you received labwork today, you will receive an invoice from [facility]. Please contact [facility] at [phone] with questions or concerns regarding your invoice.  ° °Our billing staff will not be able to assist you with questions regarding bills from these companies. ° °You will be contacted with the lab results as soon as they are available. The fastest way to get your results is to activate your My Chart account. Instructions are located on the last page of this paperwork. If you have not heard from us regarding the results in 2 weeks, please contact this office. °  ° ° ° °

## 2020-02-17 DIAGNOSIS — S80211A Abrasion, right knee, initial encounter: Secondary | ICD-10-CM | POA: Diagnosis not present

## 2020-02-17 DIAGNOSIS — S80212A Abrasion, left knee, initial encounter: Secondary | ICD-10-CM | POA: Diagnosis not present

## 2020-02-17 DIAGNOSIS — Z23 Encounter for immunization: Secondary | ICD-10-CM | POA: Diagnosis not present

## 2020-02-17 DIAGNOSIS — R030 Elevated blood-pressure reading, without diagnosis of hypertension: Secondary | ICD-10-CM | POA: Diagnosis not present

## 2020-03-02 DIAGNOSIS — E349 Endocrine disorder, unspecified: Secondary | ICD-10-CM | POA: Diagnosis not present

## 2020-03-02 DIAGNOSIS — Z13220 Encounter for screening for lipoid disorders: Secondary | ICD-10-CM | POA: Diagnosis not present

## 2020-05-01 ENCOUNTER — Telehealth: Payer: Self-pay | Admitting: Registered Nurse

## 2020-05-01 NOTE — Telephone Encounter (Signed)
Reminder: put in orders for lab work scheduled for 1/14. CPE scheduled for 1/21.

## 2020-05-02 ENCOUNTER — Other Ambulatory Visit: Payer: Self-pay

## 2020-05-02 DIAGNOSIS — F649 Gender identity disorder, unspecified: Secondary | ICD-10-CM

## 2020-05-02 DIAGNOSIS — Z789 Other specified health status: Secondary | ICD-10-CM

## 2020-05-02 DIAGNOSIS — Z130 Encounter for screening for diseases of the blood and blood-forming organs and certain disorders involving the immune mechanism: Secondary | ICD-10-CM

## 2020-05-02 DIAGNOSIS — E349 Endocrine disorder, unspecified: Secondary | ICD-10-CM

## 2020-05-02 DIAGNOSIS — Z13220 Encounter for screening for lipoid disorders: Secondary | ICD-10-CM

## 2020-05-02 DIAGNOSIS — Z1329 Encounter for screening for other suspected endocrine disorder: Secondary | ICD-10-CM

## 2020-05-04 DIAGNOSIS — F4322 Adjustment disorder with anxiety: Secondary | ICD-10-CM | POA: Diagnosis not present

## 2020-06-07 ENCOUNTER — Telehealth: Payer: Self-pay | Admitting: Family Medicine

## 2020-06-07 ENCOUNTER — Other Ambulatory Visit: Payer: Self-pay

## 2020-06-07 ENCOUNTER — Telehealth (INDEPENDENT_AMBULATORY_CARE_PROVIDER_SITE_OTHER): Payer: BC Managed Care – PPO | Admitting: Family Medicine

## 2020-06-07 ENCOUNTER — Encounter: Payer: Self-pay | Admitting: Family Medicine

## 2020-06-07 VITALS — Temp 99.9°F

## 2020-06-07 DIAGNOSIS — U071 COVID-19: Secondary | ICD-10-CM

## 2020-06-07 NOTE — Telephone Encounter (Signed)
Pt just had appointment (Virtual) and her HR is needing a note stating that her PCP  recommend that she get tested   Any questions please reach out to patient   At [phone]

## 2020-06-07 NOTE — Progress Notes (Addendum)
Virtual Visit Note  I connected with patient on 06/07/20 at 1500 by telephone due to unable to work  Epic video visit and verified that I am speaking with the correct person using two identifiers. Andrea Sparks is currently located at home and no family members are currently with them during visit. The provider, Azalee Course Olvin Rohr, FNP is located in their office at time of visit.  I discussed the limitations, risks, security and privacy concerns of performing an evaluation and management service by telephone and the availability of in person appointments. I also discussed with the patient that there may be a patient responsible charge related to this service. The patient expressed understanding and agreed to proceed.   I provided 20 minutes of non-face-to-face time during this encounter.  Chief Complaint  Patient presents with  . Nasal Congestion    Has had 3 home tests and last one was positive, [place] surgery scheduled in [place] in 2 weeks  Wants to clear symptoms   . Sore Throat    2 days ago has had a positive covid home test   . Well Child    HPI ? 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 Authorizing Provider  buPROPion (WELLBUTRIN XL) 150 MG 24 hr tablet TAKE 1 TABLET BY MOUTH ONCE DAILY IN THE MORNING 06/08/18  Yes [provider]  estradiol valerate (DELESTROGEN) 20 MG/ML injection Inject into the muscle. 08/03/18 06/07/20 Yes [provider]  methylphenidate (RITALIN) 10 MG tablet TAKE 1 TABLET BY MOUTH TWICE DAILY 06/04/18  Yes [provider]  progesterone (PROMETRIUM) 100 MG capsule Take by mouth. 08/03/18 06/07/20 Yes [provider]    History reviewed. No pertinent past medical  history.  History reviewed. No pertinent surgical history.  Social History   Tobacco Use  . Smoking status: Never Smoker  . Smokeless tobacco: Never Used  Substance Use Topics  . Alcohol use: No    History reviewed. No pertinent family history.  Review of Systems  Constitutional: Negative for chills, fever, malaise/fatigue and weight loss.  HENT: Positive for congestion and sore throat. Negative for sinus pain.   Respiratory: Negative for cough, sputum production, shortness of breath and wheezing.   Cardiovascular: Negative for chest pain and palpitations.  Gastrointestinal: Negative for abdominal pain, blood in stool, constipation, diarrhea, heartburn, nausea and vomiting.  Musculoskeletal: Negative for back pain and myalgias.  Neurological: Negative for headaches.    Objective  Constitutional:      General: Not in acute distress.    Appearance: Normal appearance. Not ill-appearing.   Pulmonary:     Effort: Pulmonary effort is normal. No respiratory distress.  Neurological:     Mental Status: Alert and oriented to person, place, and time.  Psychiatric:        Mood and Affect: Mood normal.        Behavior: Behavior normal.     ASSESSMENT and PLAN  Problem List Items Addressed This Visit   None   Visit Diagnoses    COVID-19    -  Primary     Work note sent ED precautions provided Conservative treatment: OTC remedies   Return if symptoms worsen or fail to improve.  The above assessment and management plan was discussed with the patient. The patient verbalized understanding of and has agreed to the management plan. Patient is aware to call the clinic if symptoms persist or worsen. Patient is aware when to return to the clinic for a follow-up visit. Patient educated on when it is appropriate to go to the emergency department.     Macario Carls Marvie Brevik, FNP-BC Primary Care at [facility] [address] [place], [place] [address] Ph.  [phone] Fax [phone]  I have  reviewed and agree with above documentation. Edwina Barth, MD

## 2020-06-07 NOTE — Patient Instructions (Signed)

## 2020-06-07 NOTE — Telephone Encounter (Signed)
Done sent via MyChart

## 2020-06-08 ENCOUNTER — Telehealth: Payer: BC Managed Care – PPO

## 2020-06-08 ENCOUNTER — Encounter: Payer: Self-pay | Admitting: Otolaryngology

## 2020-06-08 ENCOUNTER — Ambulatory Visit: Payer: BC Managed Care – PPO

## 2020-06-08 NOTE — Pre-Procedure Instructions (Addendum)
Pre op exam and labs at PCP on 06/15/20, patient will request office staff fax same to [facility] [phone].  No additional testing per anesthesia guidelines.

## 2020-06-15 ENCOUNTER — Other Ambulatory Visit: Payer: BC Managed Care – PPO

## 2020-06-15 ENCOUNTER — Encounter: Payer: Self-pay | Admitting: Family Medicine

## 2020-06-15 ENCOUNTER — Encounter: Payer: BC Managed Care – PPO | Admitting: Registered Nurse

## 2020-06-15 ENCOUNTER — Other Ambulatory Visit: Payer: Self-pay

## 2020-06-15 ENCOUNTER — Ambulatory Visit (INDEPENDENT_AMBULATORY_CARE_PROVIDER_SITE_OTHER): Payer: BC Managed Care – PPO | Admitting: Family Medicine

## 2020-06-15 ENCOUNTER — Other Ambulatory Visit: Payer: BLUE CROSS/BLUE SHIELD

## 2020-06-15 VITALS — BP 124/86 | HR 97 | Temp 98.6°F | Ht 71.0 in | Wt 180.4 lb

## 2020-06-15 DIAGNOSIS — F649 Gender identity disorder, unspecified: Secondary | ICD-10-CM

## 2020-06-15 DIAGNOSIS — Z20822 Contact with and (suspected) exposure to covid-19: Secondary | ICD-10-CM | POA: Diagnosis not present

## 2020-06-15 DIAGNOSIS — Z01818 Encounter for other preprocedural examination: Secondary | ICD-10-CM

## 2020-06-15 DIAGNOSIS — F4322 Adjustment disorder with anxiety: Secondary | ICD-10-CM | POA: Diagnosis not present

## 2020-06-15 NOTE — Progress Notes (Signed)
Subjective:  Patient ID: Andrea Sparks, female    DOB: 31-Jan-1991  Age: 30 y.o. MRN: [id number]  CC:  Chief Complaint  Patient presents with   surgical clearance     Surgery on 06/20/20 in [place]. It is for FEMMINIZING FOREHEAD CONTORING W/BROW LIFT, FEMINIZING LIF LIPT (MEDICAL)   Form from pt in pt door.    HPI Andrea Sparks presents for  Preoperative history/physical form completion.  Planned surgery on January 26 in [place].  Will be undergoing feminizing forehead contouring with feminizing lip lift. - facial feminization surgery Hairline advancement, feminizing rhinoplasyty, forehead contouring, lip lift, mandible contouring and tracheal shave. Outpatient surgery.  Will be staying with friend for 10 days, who also had surgery.   Current medications include Wellbutrin, Ritalin, estradiol and progesterone.  Chart reviewed, currently treated by Harlen Labs, NP at [facility] family medicine for hormone balance, gender dysphoria.  Feminizing hormone therapy. Transgender woman, assigned female at birth.  Currently followed by Jari Sportsman for PCP.  Psychiatrist: Boyd Kerbs at [facility] in [place] area.  Ritalin for ADD on workdays, not on days off or at time of planned surgery.  Wellbutrin for depression - doing well, controlled.   Depression screen [facility] 2/9 06/15/2020 06/07/2020 08/05/2019 07/12/2019 05/06/2019  Decreased Interest 0 0 0 0 0  Down, Depressed, Hopeless 0 0 0 0 0  PHQ - 2 Score 0 0 0 0 0   No personal of family hx of early heart disease known. Anesthesia for wisdom teeth in July - no issue.  No lung disease, or OSA. No loose teeth.   Dancing without CP/dyspnea. No DOE/CP with city block.  No hx of TIA/CVA, CHF, or CKD.     History Patient Active Problem List   Diagnosis Date Noted   Gender dysphoria 05/05/2017   Hormone imbalance 05/05/2017   History reviewed. No pertinent past medical history. History reviewed.  No pertinent surgical history. No Known Allergies Prior to Admission medications   Medication Sig Start Date End Date Taking? Authorizing Provider  buPROPion (WELLBUTRIN XL) 150 MG 24 hr tablet TAKE 1 TABLET BY MOUTH ONCE DAILY IN THE MORNING 06/08/18  Yes [provider]  estradiol valerate (DELESTROGEN) 20 MG/ML injection Inject into the muscle. 08/03/18 06/15/20 Yes [provider]  methylphenidate (RITALIN) 10 MG tablet TAKE 1 TABLET BY MOUTH TWICE DAILY 06/04/18  Yes [provider]  progesterone (PROMETRIUM) 100 MG capsule Take by mouth. 08/03/18 06/15/20 Yes [provider]   Social History   Socioeconomic History   Marital status: Single    Spouse name: Not on file   Number of children: Not on file   Years of education: Not on file   Highest education level: Not on file  Occupational History   Not on file  Tobacco Use   Smoking status: Never Smoker   Smokeless tobacco: Never Used  Substance and Sexual Activity   Alcohol use: No   Drug use: Never   Sexual activity: Yes  Other Topics Concern   Not on file  Social History Narrative   Not on file   Social Determinants of Health   Financial Resource Strain: Not on file  Food Insecurity: Not on file  Transportation Needs: Not on file  Physical Activity: Not on file  Stress: Not on file  Social Connections: Not on file  Intimate Partner Violence: Not on file    Review of Systems   Objective:   Vitals:  06/15/20 1510 06/15/20 1513  BP: (!) 145/89 124/86  Pulse: 97   Temp: 98.6 F (37 C)   TempSrc: Temporal   SpO2: 97%   Weight: 180 lb 6.4 oz (81.8 kg)   Height: 5\' 11"  (1.803 m)      Physical Exam Constitutional:      Appearance: She is well-developed and well-nourished.  HENT:     Head: Normocephalic and atraumatic.     Right Ear: External ear normal.     Left Ear: External ear normal.     Mouth/Throat:     Mouth: Oropharynx is clear and moist.  Eyes:      Conjunctiva/sclera: Conjunctivae normal.     Pupils: Pupils are equal, round, and reactive to light.  Neck:     Thyroid: No thyromegaly.  Cardiovascular:     Rate and Rhythm: Normal rate and regular rhythm.     Pulses: Intact distal pulses.     Heart sounds: Normal heart sounds. No murmur heard.   Pulmonary:     Effort: Pulmonary effort is normal. No respiratory distress.     Breath sounds: Normal breath sounds. No wheezing.  Abdominal:     General: Bowel sounds are normal.     Palpations: Abdomen is soft.     Tenderness: There is no abdominal tenderness.  Musculoskeletal:        General: No tenderness or edema. Normal range of motion.     Cervical back: Normal range of motion and neck supple.  Lymphadenopathy:     Cervical: No cervical adenopathy.  Skin:    General: Skin is warm and dry.     Findings: No rash.  Neurological:     Mental Status: She is alert and oriented to person, place, and time.  Psychiatric:        Mood and Affect: Mood and affect normal.        Behavior: Behavior normal.        Thought Content: Thought content normal.      31 minutes spent during visit, greater than 50% counseling and assimilation of information, chart review, and discussion of plan.    Assessment & Plan:  Andrea Sparks is a 30 y.o. female . Preoperative evaluation to rule out surgical contraindication - Plan: PT AND PTT, CBC with Differential/Platelet  Gender dysphoria  Plan facial feminization surgery as above.  No apparent increased risk of major adverse cardiac event based on history, no concerns on physical exam or history.  Will not be on stimulant medication at time of surgery. CBC with differential, PTT, PT ordered per surgical request.  Other labs, EKG or x-ray do not appear to be necessary at this time.  Paperwork completed for 37.   No orders of the defined types were placed in this encounter.  Patient Instructions       If you have lab work done  today you will be contacted with your lab results within the next 2 weeks.  If you have not heard from Radiographer, therapeutic then please contact us. The fastest way to get your results is to register for My Chart.   IF you received an x-ray today, you will receive an invoice from [facility] Rad[facility]. Please contact [facility] Radiology at [phone] with questions or concerns regarding your invoice.   IF you received labwork today, you will receive an invoice from [place]. Please contact [facility] at [phone] with questions or concerns regarding your invoice.   Our billing staff will not be able to  assist you with questions regarding bills from these companies.  You will be contacted with the lab results as soon as they are available. The fastest way to get your results is to activate your My Chart account. Instructions are located on the last page of this paperwork. If you have not heard from [place] regarding the results in 2 weeks, please contact this office.          Signed, Merri Ray, MD [facility]

## 2020-06-15 NOTE — Patient Instructions (Addendum)
  Good luck with your upcoming surgery.  Please let me know if surgeon needs other information from our office, but blood count and PT/PTT should be available by MyChart in the next few days.   If you have lab work done today you will be contacted with your lab results within the next 2 weeks.  If you have not heard from [place] then please contact us. The fastest way to get your results is to register for My Chart.   IF you received an x-ray today, you will receive an invoice from [facility] Radiology. Please contact [facility] Radiology at [phone] with questions or concerns regarding your invoice.   IF you received labwork today, you will receive an invoice from [place]. Please contact [facility] at [phone] with questions or concerns regarding your invoice.   Our billing staff will not be able to assist you with questions regarding bills from these companies.  You will be contacted with the lab results as soon as they are available. The fastest way to get your results is to activate your My Chart account. Instructions are located on the last page of this paperwork. If you have not heard from [place] regarding the results in 2 weeks, please contact this office.

## 2020-06-16 LAB — CBC WITH DIFFERENTIAL/PLATELET
Basophils Absolute: 0.1 10*3/uL (ref 0.0–0.2)
Basos: 1 %
EOS (ABSOLUTE): 0.2 10*3/uL (ref 0.0–0.4)
Eos: 2 %
Hematocrit: 42.8 % (ref 34.0–46.6)
Hemoglobin: 14 g/dL (ref 11.1–15.9)
Immature Grans (Abs): 0.1 10*3/uL (ref 0.0–0.1)
Immature Granulocytes: 1 %
Lymphocytes Absolute: 2.6 10*3/uL (ref 0.7–3.1)
Lymphs: 26 %
MCH: 28.3 pg (ref 26.6–33.0)
MCHC: 32.7 g/dL (ref 31.5–35.7)
MCV: 87 fL (ref 79–97)
Monocytes Absolute: 0.6 10*3/uL (ref 0.1–0.9)
Monocytes: 6 %
Neutrophils Absolute: 6.3 10*3/uL (ref 1.4–7.0)
Neutrophils: 64 %
Platelets: 327 10*3/uL (ref 150–450)
RBC: 4.95 x10E6/uL (ref 3.77–5.28)
RDW: 11.6 % — ABNORMAL LOW (ref 11.7–15.4)
WBC: 9.7 10*3/uL (ref 3.4–10.8)

## 2020-06-16 LAB — PT AND PTT
INR: 0.9 (ref 0.9–1.2)
Prothrombin Time: 9.8 s (ref 9.1–12.0)
aPTT: 27 s (ref 24–33)

## 2020-06-17 LAB — NOVEL CORONAVIRUS, NAA

## 2020-06-17 LAB — SARS-COV-2, NAA 2 DAY TAT

## 2020-06-18 ENCOUNTER — Other Ambulatory Visit: Payer: Self-pay | Admitting: Otolaryngology

## 2020-06-19 NOTE — Op Note (Signed)
FULL OPERATIVE NOTE    Date Time: 06/20/20 3:49 PM  Patient Name: Hayley Sanders  Attending Physician: Jodi Geralds, MD      Date of Operation:   06/20/2020    Providers Performing:   Surgeon(s):  Jodi Geralds, MD - Attending Surgeon  Melony Overly, MD - PGY 5 Resident    Circulator: Kathleen Argue, RN  Relief Circulator: Dorene Sorrow, Juliette Alcide, RN  Relief Scrub: Cheral Bay D.; Sternagle, Elberta Fortis Person: Dalbert Batman  Second Circulator: Artis Delay    Operative Procedure:   Procedure(s):  1) Feminizing Forehead contouring with anterior table frontal sinus set back (CPT [phone], [phone])  2) Bilateral feminizing orbital rim reconstruction (CPT 21172)   3) Bone autograft to forehead (CPT 20900)  4) Bilateral feminizing forehead rhytidectomy, excision of temporal recessions (CPT 15824 - modifier 50)  5) Bilateral feminizing brow lift (CPT 67900 - modifier 50)  6) Bilateral feminizing scalp advancement Primary and Secondary Defect 16x25cm (CPT 14301, 14302 x 12 units)  7) Feminizing rhinoplasty (CPT [phone], 30410)  8) Feminizing Mandible contouring (CPT 21299, 21025, 21120)  9) Feminizing Lip Lift (CPT 40799)  10) Feminizing chondrolaryngoplasty/Tracheal Shave (CPT [phone])       Preoperative Diagnosis:   Pre-Op Diagnosis Codes:     * Transsexualism [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 Hayley, Sanders is a 30 y.o.-year-old transgender female with gender dysphoria on estrogen hormone replacement therapy. She was last seen on 06/18/20 for a surgical consultation after her initial telehealth consultation in January of 2021. Planned surgery includes:    1) Feminizing Forehead contouring with anterior table frontal sinus set back (CPT [phone], 21139)  2) Bilateral feminizing orbital rim reconstruction (CPT 21172)   3) Bone autograft to forehead (CPT 20900)  4) Bilateral feminizing forehead rhytidectomy, excision of  temporal recessions (CPT 15824 - modifier 50)  5) Bilateral feminizing brow lift (CPT 67900 - modifier 50)  6) Bilateral feminizing scalp advancement Primary and Secondary Defect  7) Feminizing rhinoplasty (CPT [phone], 30410)  8) Feminizing Mandible contouring (CPT 21299, 21025, 21120)  9) Feminizing Lip Lift (CPT [phone])  10) Feminizing chondrolaryngoplasty/Tracheal Shave (CPT [phone])       Each of these adjustments will allow for a more feminine appearance which would aid in her gender dysphoria. Procedures and expectations were reviewed again today with the patient. Postoperative instructions were discussed today and will be discussed again at her first postoperative visit.     I had a lengthy discussion with the patient regarding the benefits, alternatives, and risks of the planned facial feminization surgery including but not limited to bleeding, scarring, infection, numbness, facial weakness including weakness of raising her brows or closure of her eye, time for complete healing, early versus late appearance, facial features and asymmetries not expected to improve, failure to correct the problem and the potential need for further revision surgery, skin loss or skin necrosis. Other risks including swelling, bruising, nasal congestion, nasal stuffiness, septal perforation, numbness of the scalp, face, lips and lateral jaw, abscess or fluid collection, hematoma formation, voice changes (temporary or permanent) hypertrophic scar formation, visible scar formation, hair loss, injury to the brain, eyes, lips, nose, mouth, teeth, gums. No guarantees were made or implied. Written consent was obtained.       Operative Notes:   The patient was greeted in the pre-operative area where the patient's history,  physical and consent were reviewed. We discussed once again the anticipated incisions, expected healing and wound care obligations. The patient's pre-operative covid testing was found to be negative and as such was  appropriate to move forward with surgery. The patient was then wheeled back to the OR room and placed onto the OR bed in the supine position. A final timeout was then completed confirming the patient's name, identity and planned procedure. The patient was then induced into general anesthesia and intubated with a laryngeal mask airway by the anesthesia provider and taped to the midline upper lip in the mesentery technique. Next, the patient's arms and legs were then padded and the patient was then secured using straps and tape with the arms placed into the papoose position.    Starting with the feminizing tracheal shave/chondrolaryngoplasty, a 1.5cm incision was marked in the posterior/inferior submental region. 10cc of 1% lidocaine with epinephrine was then injected into the incision and surrounding thyroid cartilage region. The marked incision was confirmed to be able to stretch and pulled down over the prominent thyroid notch. A 15 blade was then used to incise the marked incision. Two curved hemostats and sen retractors were then used to blunted dissected down the midline raphe, splitting the strap muscles. The mid-line fascia was separated using monopolar cautery. The thyroid cartilage was then directly visualized by using monopolar cautery to identify and separate the perichondrium. Both senn retractors were then placed pulling the perichondrium away from the underlying thyroid cartilage. Next, the anesthesia provider placed a flexible fiberoptic bronchoscope through the LMA with direct visualization of bilateral true vocal folds. A 22 gauge needle on a 3cc syringe was then passed through the anterior thyroid cartilage until the needle was visualized within the upper aerodigestive tract at exactly the level of the anterior commissure. Monopolar cautery was then used to mark 1-11mm above the level of the needle along the thyroid cartilage anteriorly and also extending laterally both on the right and the left. The  needle was then removed from the neck and returned to the Glenaire stand. Next, ronguer forceps were then used to remove the thyroid notch and cartilage prominence from the marked point superiorly, making sure not to cause a thyroid cartilage fracture or to remove cartilage inferior to this point in order to protect and preserve vocal fold positioning and tension. Once the cartilage was removed, the anterior neck was visualized to exam that the prominence had been removed for an appropriate feminine appearing neck line.  The wound was inspected and found to have adequate hemostasis. The strap muscles were then reapproximated with 5-0 vicryl in a running fashion, followed by deep interrupted dermal sutures and 4-0 monocryl in a running subcuticular fashion at the level of the skin and dermabond.     Next, the LMA was then removed by the anesthesia team and the patient was intubated using a regular ET tube which was then secured to the lower lip in the mesentery technique. The patient was then turned 180 degrees. Bilateral arms and legs were then padded and the arms were then tucked into a papoose position. Foley catheter was placed.     Following repositioning the patient into the beach chair position, the forehead aspect of the procedure began. The anticipated pretrichial incision was then marked in an irregular fashion along the anterior hairline and then extending in a coronal fashion posterior to bilateral temporal tufts. The open approach columellar incision was marked for the rhinoplasty followed by a bull-horn style incision extending  in the naso-alar groove laterally only, sparing the midline lip approximately 7-77mm. Next, The temporal tuft hair was then retracted and secured using autoclave tape and the posterior hair was secured and retracted in a circumferential fashion. Bilateral eyes were then protected with lacrilube and were taped into a closed position. The patient's face and hair were then prepped using  betadine making sure to protect the patient's eyes during the application. 30cc of 1% lidocaine with epinephrine was then injected into the marked incision, posterior scalp, forehead and orbital rim regions.      A 10 blade was then used to make an incision along the irregular pre-trichael marked incision in a beveled fashion. The incision was completed down through the skin to the level of the cranium. This was completed on both the right and the left. Hemostasis was achieved using a combination of monopolar and bipolar cautery making sure to minimize thermal injury to the associated hair follicles. Langenbeck periosteal elevators were then used to raise a forehead flap in a subpericranial plane. A freer was used to release the medial aspect of the deep temporal fascia to allow additional flexibility of the forehead flap and also to protect the frontal branch of the facial nerve. The skin flap was taken down to the level of the orbital rim, over the frontal bar and releasing the arcus marginalis ligament allowing for adequate exposure of bilateral superior orbital rims and extending laterally to the zygomatic-frontal suture. The glabellar region was released and extending onto the superior frontal bones. Bilateral V1 nerves were identified and preserved. Double prong skin hooks were then placed securing the retracted position of the forehead skin flap.      Next, a marking pen was then used to outline the anticipated frontal sinus. A 5mm fluted cutting burr and drill was the used to slowly remove and contour the mid-forehead starting in the glabellar region. The frontal sinus was encountered and blue-lined in this region in a circumferential fashion. The frontal sinus was then continued to be blue-lined laterally to the left and the right. The bony prominence of the lateral forehead was contoured away using the drill both on the right and left for a feminine aesthetic, making sure to leave the anterior table of the  frontal sinus intact but with the surrounding bone thinned in anticipation of removing the frontal sinus and subsequent set-back. Each pass with the drill, bony autograft fragments were collected into a small steel cup purposefully and not incidentally. This was completed in anticipation for use of the bone graft for securing and reshaping of the forehead region.      With the anterior table of the frontal sinus blue-lined anteriorly and laterally, further drilling was completed with no additional identification of the frontal sinus along the midline. As such, further circumferential drilling around the bone anticipated to be the anterior table frontal sinus was completed. After further drilling, only an approximately 3-7mm portion of anterior table was identified to be associated with the frontal sinus on the left and separately a 2mm area on the right. The remaining area of the frontal bar was solid bone. A mallet was then used to gently depressed the small anterior table frontal sinus bone into the frontal sinus in a recessed position. Given the small size of the bony fragment and flush position of the bone, it was decided to not plate this portion of the anterior table.          Next, the 5mm cutting burr was  then used to reduce the lateral forehead bony prominence into a smooth contour from the orbital rim and glabellar region allowing for a smooth and feminine aesthetic. The drill was then used to reduce the lateral orbital rim region including extending down to the level of the zygomatico-frontal suture line. All drilling was completed with copious amounts of irrigation to ensure healthy bone.  Next, a periosteal elevator was used to retract the orbital septum first on the left followed by the right, and the 5mm diamond burr was used to reduce the prominence of bilateral orbital rims and reconstructing the rims for a less acute angle leading to an opening of the orbital sockets. Replacement of the skin flap  showed a smooth forehead contour and open orbital rim aesthetic leading to a much improved feminine appearance.  The bone pate originally harvested during the drilling/contouring was then applied around the very small area of depressed anterior table frontal bone and exposed frontal sinus edges for a smooth feminine contour using a freer.      Next, the forehead flap was then lifted back into its native position and then subsequently retracted superiorly to allow for adequate brow lift. With the brow pulled to approximately 1cm above the orbital rim with the apex at the lateral iris bilaterally the lifted brow lift position was marked on the frontal bone. Next, a converging brow lift drill guide was then placed at the marked sites and using a 2mm wire pass drill, the converging bony anchor point was drilled and created to hold the elevated brows into position. These bony anchor points were created both on the right and left with copious amounts of irrigation to maintain healthy bone during the drilling. Next 3-0 silk on RB-1 suture were then passed through the anchor point on the right and left and then the brows were then lifted into the desired position and sutured to the anchor point in a mattress like fashion.      Next, the posterior scalp was then retracted using double prong skin hooks. The posterior scalp was then released in a subgaleal plane using monopolar cautery. Multiple galeotomies were then completed using a 15 blade to allow for maximum mechanical creep to advance the posterior hair. Bilateral double prong hooks were then used to pull the posterior scalp anteriorly, stretching the galea and skin to advance the hairline. The total primary and secondary defect of the advancement was 16x25cm.      In order to provide our patient with a feminine rounded hairline, the forehead rhytidectomy needed to be completed next. The patient had evidence of bilateral temporal recessions leading to a masculine shaped  square hairline. With the advanced posterior scalp, the forehead skin was then pulled posteriorly and moving from mid-line and then laterally the forehead skin was then excised and medially rotated. This allowed for excision of the temporal recession skin and closure of the defect of this skin with the advancement posterior hair bearing scalp. This process was completed in 1-2cm increments with each increment allowing for excision of forehead skin and advancement of the hairline by approximately 2.5 to 3cm. The excised forehead skin was secured to the advanced posterior scalp with 4-0 nylon tacking sutures at the level of the skin. 4-0 monocryl deep dermal sutures were used to secure the closure and 4-0 monocryl subcuticular running sutures were used at the level of the skin until the temporal hair region which was closed with 4-0 monocryl deep dermal sutures and staples at the level  of the skin. Just prior to completion of closure, there was evidence of a left temporal region hematoma. Staples were removed and sutures cut on the left and the wound was washed out and explored. There was evidence of arterial bleeding along the lateral forehead skin flap which was then cauterized with bipolar cautery. This controlled the bleeding. The wounds were then re-approximated in the same fashion as the prior to finding the hematoma.     Next, 10cc of 1% lidocaine with epinephrine was then injected into the nose including nasal dorsum, nasal side walls, tip, septum, columella. Given the patient's request for dorsal reduction and tip rotation and reduction, an open approach was utilized. An inverted V incision was then marked on the columellar. A 15 blade was then used to incise the marked incision using multiple stab incisions. A converse scissors was then used to dissect anterior to the medial crura and releasing the columellar skin away from the underlying cartilage. The incision was then opened with the converse scissors.  The soft tissue envelope was then dissected off of the underlying nasal structure and dorsum including both bony and cartilaginous dorsum utilizing three point retraction with two micro-fixation skin hooks and a single wide double wide skin hook allowing for exposure of the vestibular caudal edge of the lower lateral crura to be exposed. With one converse scissor tine in and tine out, the marginal incisions were then completed first on the left then the right. An aufrecht retractor was then placed over the dorsum exposing the bony and cartilaginous dorsum.  A cottle elevator was then used to remove any remaining periosteum and perichondrium off of the dorsum and nasal bones. An aufrecht retractor was then placed with visualized of the dorsal hump which appeared both bony and cartilaginous. Dorsal rasps were then used to reduce the bony dorsum and nasal bones to aid with improved dorsal reduction and dorsal slope which aids with the feminization of the nose. The dorsum was then irrigated with saline making sure to remove all o the remaining debris from the rasp. The upper lateral cartilages were then separated from the midline septum with a cottle elevator and 15 blade. The 15 blade was then used to reduce the dorsum for a nice curved dorsum with supratip break leading to an improved feminine appearance. With reduction, there remained at least 15mm of dorsal and caudal septal struts for adequate support.  The cephalic trim simultaneously allowed slight rotation and deprojection of the nose also providing for a more feminine structure and appearance to the nose. Autospreader grafts were then placed bilaterally in order to aid with support of the dorsum in addition to straightening the mid-vault of her nose. The reapproximated nasal tip was rotated and deprojected more by suturing to the septum dorsally. These were secured with 5-0 PDS in a mattress like fashion. Crushed cartilage was then placed into the radix region  to aid with additional feminization by blunting the radix allowing for a smooth transition from the nasal dorsum to the forehead region. Given no evidence of open roof deformity and very short nasal bones, osteotomies were note completed.         Next, the columellar incision was closed with 5-0 FAST absorbing gut, marginal incisions were then closed with 4-0 chromic in an interrupted fashion followed by a quilting stitch to reapproximate the submucopericondrial flaps into the midline. The nose was then cleaned with saline soaked gauze followed by placement of mastisol, 1/2 steristrips and a thermoplast splint.  Moving onto the lip lift, the previously marked lateral lip lift bull horn incision was then injected with 5cc of 1% lidocaine with epinephrine. A 15 blade was then used to complete the marked incision. Hemostasis was then achieved with monpolar cautery following removal of the bull horn shaped skin and dermis down to the level of the orbicularis oris fascia. Starting in the midline, 5-0 vicryl in a deep dermal fashion was used to reapproximate both feminizing lateral lip lift incisions. This suture also allowed for slight rolling of the orbicularis muscle leading to improved red lip show and slight incisor tooth show which allows for a more feminine appearance to the perioral region. The additional interrupted deep sutures with 5-0 vicryls were completed moving laterally both on the right and left. Medialization of the philtral ridges was completed during these manuevers to maintain appropriate width of the cupids bow and prominence of the philtral ridges. The skin was then closed using 6-0 nylon in a running locking fashion. The incision was then cleaned and mupirocin placed.      Finally, 20 cc of 1% lidocaine with epinephrine was then injected into the inferior mandible region extending from the angle to the para-symphysis. Next, army-navy retractors were then placed into the right oral cavity  retracting the cheek and exposing the right gingivo-buccal sulcus. Monopolar cautery on the cut function was then used to make a gingivobuccal incision extending just posterior to the premolars and posteriorly to the posterior limit of the patient's molars. This was completely approximately 1 cm from the gingiva to leave an adequate cuff of soft tissue from closure. Next, a langenbeck periosteal elevator was then used to expose the mandible from the level of the angle to the parasymphysis. Once exposed on the right, a saline soaked gauze was placed into the incision and the same incision was completed on the left for symmetrical approaches. Using a reciprocating rasp, the lateral jaw bone was substantially reduced from posterior to anterior. A minimum of 5-22mm of bone was removed along the angle and this was contoured anteriorly to the level of the para symphysis. This was then once again repeated on the left for nice symmetrical results. Next, a gingivolabial incision was then created with the cut function of monopolar cautery. Dissection then occurred down to the inferior border of the chin. A periosteal elevator was then used to remove the overlying periosteum and extending laterally to connect to the gingivobuccal incisions. Bilateral V3 nerves were identified and preserved. The rasp was then used to reduce the chin prominence including reduction of projection, height and lateral width. These reductions allowed for an improved v-line shaped mandible contour for a softer more feminine contour.  The patient was noted to have extensive and large mentalis muscle and soft tissue of the chin. The wounds were then irrigated with saline and once hemostasis was achieved with monopolar cautery, the intra-oral incisions were then closed in a running locking fashion with 3-0 chromic. This marked the end of the procedure.      Next, the hair was washed using a combination of hydrogen peroxide and shampoo, dried, conditioned  and then combed. The external incisions were covered with bacitracin and telfa gauze for the forehead. 4in kerlex gauze was then used to wrap the head circumferentially in both a vertical and horizontal fashion followed by 4in Ace wraps. All needle, sponge and instrument counts were correct at the end of the case. The patient was then turned back to anesthesia and extubated without complication.  The patient was then transferred to a PACU bed and taken to pacu for further recovery.       Estimated Blood Loss:   300cc    Implants:   None    Drains:   Drains: no    Specimens:   None      Complications:   None    Signed by: Jodi Geralds, MD, MD

## 2020-06-19 NOTE — H&P (Signed)
Otolaryngology Consultation     Bonney Aid, MD  Facial Plastic and Reconstructive Surgeon  [facility]  [address]  [place], [place] [address]  [phone]    06/19/20    ID: Ivyrose Hashman is a 30 y.o. adult transgender female with gender dysphoria in an adult on hormone therapy including estrogen based therapy. She is here today for her planned facial feminization surgery. She was last seen 06/24/2019 and then 06/18/20 and she attributed a portion of her gender dysphoria with her very prominent brow bone, shadowing of her eyes, large nose, cleft chin, wide jaw, receding hairline and Adams apple. In order to address her gender dysphoria I recommended 1) Forehead contouring including brow bone reduction with scalp advancement, 2) Brow lift, 3) Feminizing rhinoplasty (open rhinoplasty), 4) Lip Lift (lateral), 5) Feminizing mandible contouring (lateral and chin), 6) Trach shave (chondrolaryngoplasty). Each of these adjustments will allow for a more feminine appearance which would aid in her gender dysphoria.     The risks and benefits of surgery including but not limited to pain, bleeding, infection, scar formation, facial asymmetry, failure to correct the problem, less than ideal cosmetic outcome, inability to pass as cis-gender, injury to the eyes, brain, teeth, lips, gums, septal perforation, septal hematoma, nasal obstruction, nasal deviations or asymmetries, dental associated infections, skin death and necrosis, need for additional procedures, facial weakness, lip weakness, movement issues with the face and lips, voice changes (temporary or permanent), keloid formation, thickened scar, and need for revision surgery in addition to anesthesia risks. Other risks include need for implant use or implant extrusion.    Of note, the patient was diagnosed with covid (minimal symptoms) > 10 days ago.       There are no problems to display for this patient.    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     No Known Allergies   Social History     Tobacco Use    Smoking status: Never Smoker    Smokeless tobacco: Never Used   Substance Use Topics    Alcohol use: Never      History reviewed. No pertinent family history.     Review of Systems  14 pot ROS completed and negative except for above.     Objective:     There were no vitals filed for this visit.    Physical Exam  GENERAL APPEARANCE: pleasant, well nourished, well developed, in no acute distress.   COMMUNICATION: oral communication, normal quality of voice.Marland Kitchen   HEAD: normocephalic, atraumatic, dyed hair.Marland Kitchen   FACE: Normal strength and symmetry. Bilateral temporal recessions with slight decrease in anterior hair density. Prominent frontal bar with shadowing of the upper eyelid and orbits. Eyebrows at the level of the supraorbital rim. Slight flatness to anterior cheeks. Wide jaw laterally with prominent angle of the mandible. Prominences along lateral chin region with midline cleft.Marland Kitchen   EYES: extraocular movement intact (EOMI).   EARS: pinnae and lobes normal.   HEARING: clinically adequate.   NOSE: Dorsal hump present with acute nasofrontal angle. ptotic tip. Acute nasolabial angle. Underrotated for feminine appearance. Medium skin thickness upon visual inspection only. Slight columellar show.Marland Kitchen   NECK/THYROID: Prominence of the thyroid cartilage  at rest and with neck extension..   RESPIRATORY: chest expansion normal and symmetric.   CARDIOVASCULAR: No swelling or tenderness of extremities.   NEUROLOGIC: cranial nerves 2-12 grossly intact.   SKIN: normal, no suspicious lesions.     Imaging: None      Assessment:     Ashanty Coltrane is a 30 y.o. adult transgender female here for facial feminization surgery. Risks and benefits reviewed and written  consent was obtained during pre-op visit at our office and also with the digital consent today. Alternative including observation discussed. Patient acknowledges the risks and would like to proceed with surgery.     Plan:     -Proceed to OR for facial feminization  -Anticipate 23 hour observation  -Anticipate [place] 06/21/20        Jodi Geralds, MD

## 2020-06-20 ENCOUNTER — Observation Stay
Admission: RE | Admit: 2020-06-20 | Discharge: 2020-06-21 | Disposition: A | Discharge status: Home / Self Care | Payer: BC Managed Care – PPO | Source: Ambulatory Visit | Attending: Otolaryngology | Admitting: Otolaryngology

## 2020-06-20 ENCOUNTER — Ambulatory Visit: Payer: BC Managed Care – PPO | Admitting: Anesthesiology

## 2020-06-20 ENCOUNTER — Encounter (HOSPITAL_BASED_OUTPATIENT_CLINIC_OR_DEPARTMENT_OTHER)
Admission: RE | Disposition: A | Discharge status: Home / Self Care | Payer: Self-pay | Source: Ambulatory Visit | Attending: Otolaryngology

## 2020-06-20 ENCOUNTER — Encounter: Payer: Self-pay | Admitting: Otolaryngology

## 2020-06-20 DIAGNOSIS — F640 Transsexualism: Principal | ICD-10-CM | POA: Insufficient documentation

## 2020-06-20 DIAGNOSIS — J3489 Other specified disorders of nose and nasal sinuses: Secondary | ICD-10-CM | POA: Insufficient documentation

## 2020-06-20 HISTORY — DX: Reserved for inherently not codable concepts without codable children: IMO0001

## 2020-06-20 HISTORY — PX: LOCAL FLAP: SHX4761

## 2020-06-20 HISTORY — DX: Depression, unspecified: F32.A

## 2020-06-20 HISTORY — PX: ORIF, MANDIBLE: SHX4900

## 2020-06-20 HISTORY — PX: RHINOPLASTY, LEVEL 2: SHX5456

## 2020-06-20 HISTORY — DX: Other specified behavioral and emotional disorders with onset usually occurring in childhood and adolescence: F98.8

## 2020-06-20 HISTORY — PX: TRACHEOSTOMY: SHX5626

## 2020-06-20 HISTORY — PX: FACELIFT, (RHYTIDECTOMY): SHX4094

## 2020-06-20 SURGERY — FACELIFT, (RHYTIDECTOMY) (MEDICAL)
Anesthesia: Anesthesia General | Site: Nose | Wound class: Clean

## 2020-06-20 MED ORDER — LIDOCAINE HCL (PF) 2 % IJ SOLN
INTRAMUSCULAR | Status: AC
Start: 2020-06-20 — End: ?
  Filled 2020-06-20: qty 5

## 2020-06-20 MED ORDER — AMMONIA AROMATIC IN INHA
1.0000 | Freq: Once | RESPIRATORY_TRACT | Status: DC | PRN
Start: 2020-06-20 — End: 2020-06-20

## 2020-06-20 MED ORDER — ROCURONIUM BROMIDE 50 MG/5ML IV SOLN
INTRAVENOUS | Status: AC
Start: 2020-06-20 — End: ?
  Filled 2020-06-20: qty 5

## 2020-06-20 MED ORDER — SODIUM CHLORIDE 0.9 % IV SOLN
INTRAVENOUS | Status: DC | PRN
Start: 2020-06-20 — End: 2020-06-20

## 2020-06-20 MED ORDER — FENTANYL CITRATE (PF) 50 MCG/ML IJ SOLN (WRAP)
25.0000 ug | INTRAMUSCULAR | Status: DC | PRN
Start: 2020-06-20 — End: 2020-06-20
  Administered 2020-06-20: 25 ug via INTRAVENOUS

## 2020-06-20 MED ORDER — FLUMAZENIL 0.5 MG/5ML IV SOLN
INTRAVENOUS | Status: DC | PRN
Start: 2020-06-20 — End: 2020-06-20
  Administered 2020-06-20: .3 mg via INTRAVENOUS

## 2020-06-20 MED ORDER — CEFAZOLIN SODIUM 1 G IJ SOLR
INTRAMUSCULAR | Status: DC | PRN
Start: 2020-06-20 — End: 2020-06-20
  Administered 2020-06-20 (×2): 2 g via INTRAVENOUS

## 2020-06-20 MED ORDER — FENTANYL CITRATE (PF) 50 MCG/ML IJ SOLN (WRAP)
INTRAMUSCULAR | Status: AC
Start: 2020-06-20 — End: ?
  Filled 2020-06-20: qty 2

## 2020-06-20 MED ORDER — ONDANSETRON HCL 4 MG/2ML IJ SOLN
4.0000 mg | Freq: Once | INTRAMUSCULAR | Status: DC | PRN
Start: 2020-06-20 — End: 2020-06-20

## 2020-06-20 MED ORDER — PROMETHAZINE HCL 25 MG/ML IJ SOLN
6.2500 mg | Freq: Once | INTRAMUSCULAR | Status: DC | PRN
Start: 2020-06-20 — End: 2020-06-20

## 2020-06-20 MED ORDER — PROPOFOL 10 MG/ML IV EMUL (WRAP)
INTRAVENOUS | Status: DC | PRN
Start: 2020-06-20 — End: 2020-06-20
  Administered 2020-06-20: 240 mg via INTRAVENOUS

## 2020-06-20 MED ORDER — SODIUM CHLORIDE 0.9 % IV SOLN
12.5000 mg | Freq: Four times a day (QID) | INTRAVENOUS | Status: DC | PRN
Start: 2020-06-20 — End: 2020-06-21

## 2020-06-20 MED ORDER — PHENYLEPHRINE HCL-NACL 100-0.9 MG/250ML-% IV SOLN
INTRAVENOUS | Status: AC
Start: 2020-06-20 — End: ?
  Filled 2020-06-20: qty 250

## 2020-06-20 MED ORDER — MIDAZOLAM HCL 1 MG/ML IJ SOLN (WRAP)
INTRAMUSCULAR | Status: AC
Start: 2020-06-20 — End: ?
  Filled 2020-06-20: qty 2

## 2020-06-20 MED ORDER — MUPIROCIN 2 % EX OINT
TOPICAL_OINTMENT | CUTANEOUS | Status: DC | PRN
Start: 2020-06-20 — End: 2020-06-20
  Administered 2020-06-20: 1 via TOPICAL

## 2020-06-20 MED ORDER — DEXMEDETOMIDINE HCL IN NACL 200 MCG/50ML IV SOLN
INTRAVENOUS | Status: DC | PRN
Start: 2020-06-20 — End: 2020-06-20
  Administered 2020-06-20: .3 ug/kg/h via INTRAVENOUS

## 2020-06-20 MED ORDER — DEXMEDETOMIDINE HCL IN NACL 200 MCG/50ML IV SOLN
INTRAVENOUS | Status: AC
Start: 2020-06-20 — End: ?
  Filled 2020-06-20: qty 50

## 2020-06-20 MED ORDER — OXYCODONE HCL 5 MG PO TABS
10.0000 mg | ORAL_TABLET | ORAL | Status: DC | PRN
Start: 2020-06-20 — End: 2020-06-20

## 2020-06-20 MED ORDER — ACETAMINOPHEN 500 MG PO TABS
1000.0000 mg | ORAL_TABLET | Freq: Four times a day (QID) | ORAL | Status: DC
Start: 2020-06-20 — End: 2020-06-21
  Administered 2020-06-20 – 2020-06-21 (×3): 1000 mg via ORAL
  Filled 2020-06-20 (×3): qty 2

## 2020-06-20 MED ORDER — ALBUMIN HUMAN/BIOSIMILIAR 5% IV SOLN (WRAP)
INTRAVENOUS | Status: AC
Start: 2020-06-20 — End: ?
  Filled 2020-06-20: qty 250

## 2020-06-20 MED ORDER — ACETAMINOPHEN 10 MG/ML IV SOLN
1000.0000 mg | Freq: Once | INTRAVENOUS | Status: AC
Start: 2020-06-20 — End: 2020-06-20
  Administered 2020-06-20: 15:00:00 1000 mg via INTRAVENOUS
  Filled 2020-06-20: qty 100

## 2020-06-20 MED ORDER — ALBUMIN HUMAN/BIOSIMILIAR 5% IV SOLN (WRAP)
INTRAVENOUS | Status: DC | PRN
Start: 2020-06-20 — End: 2020-06-20

## 2020-06-20 MED ORDER — MAGNESIUM SULFATE 50 % IJ SOLN
INTRAMUSCULAR | Status: DC | PRN
Start: 2020-06-20 — End: 2020-06-20
  Administered 2020-06-20 (×2): 1 g via INTRAVENOUS

## 2020-06-20 MED ORDER — FAMOTIDINE 20 MG/2ML IV SOLN
INTRAVENOUS | Status: AC
Start: 2020-06-20 — End: ?
  Filled 2020-06-20: qty 2

## 2020-06-20 MED ORDER — CEFAZOLIN SODIUM-DEXTROSE 2-3 GM-%(50ML) IV SOLR
INTRAVENOUS | Status: AC
Start: 2020-06-20 — End: ?
  Filled 2020-06-20: qty 100

## 2020-06-20 MED ORDER — FLUMAZENIL 0.5 MG/5ML IV SOLN
INTRAVENOUS | Status: AC
Start: 2020-06-20 — End: ?
  Filled 2020-06-20: qty 5

## 2020-06-20 MED ORDER — NEOSTIGMINE METHYLSULFATE 1 MG/ML IJ/IV SOLN (WRAP)
Status: AC
Start: 2020-06-20 — End: ?
  Filled 2020-06-20: qty 5

## 2020-06-20 MED ORDER — SODIUM CHLORIDE 0.9 % IR SOLN
Status: DC | PRN
Start: 2020-06-20 — End: 2020-06-20
  Administered 2020-06-20: 4000 mL

## 2020-06-20 MED ORDER — LIDOCAINE 4 % EX CREA
TOPICAL_CREAM | Freq: Once | CUTANEOUS | Status: DC | PRN
Start: 2020-06-20 — End: 2020-06-20

## 2020-06-20 MED ORDER — PROPOFOL 10 MG/ML IV EMUL (WRAP)
INTRAVENOUS | Status: AC
Start: 2020-06-20 — End: ?
  Filled 2020-06-20: qty 40

## 2020-06-20 MED ORDER — KETAMINE HCL 50 MG/ML IJ SOLN
INTRAMUSCULAR | Status: DC | PRN
Start: 2020-06-20 — End: 2020-06-20
  Administered 2020-06-20: 40 mg via INTRAVENOUS
  Administered 2020-06-20 (×6): 10 mg via INTRAVENOUS

## 2020-06-20 MED ORDER — GLYCOPYRROLATE 0.2 MG/ML IJ SOLN (WRAP)
INTRAMUSCULAR | Status: DC | PRN
Start: 2020-06-20 — End: 2020-06-20
  Administered 2020-06-20: .6 mg via INTRAVENOUS
  Administered 2020-06-20: .1 mg via INTRAVENOUS

## 2020-06-20 MED ORDER — ROCURONIUM BROMIDE 50 MG/5ML IV SOLN
INTRAVENOUS | Status: AC
Start: 2020-06-20 — End: ?
  Filled 2020-06-20: qty 10

## 2020-06-20 MED ORDER — ONDANSETRON HCL 4 MG/2ML IJ SOLN
4.0000 mg | Freq: Four times a day (QID) | INTRAMUSCULAR | Status: DC | PRN
Start: 2020-06-20 — End: 2020-06-21

## 2020-06-20 MED ORDER — NALOXONE HCL 0.4 MG/ML IJ SOLN (WRAP)
0.2000 mg | INTRAMUSCULAR | Status: DC | PRN
Start: 2020-06-20 — End: 2020-06-21

## 2020-06-20 MED ORDER — GLYCOPYRROLATE 0.2 MG/ML IJ SOLN (WRAP)
INTRAMUSCULAR | Status: AC
Start: 2020-06-20 — End: ?
  Filled 2020-06-20: qty 3

## 2020-06-20 MED ORDER — OXYMETAZOLINE HCL 0.05 % NA SOLN
NASAL | Status: DC | PRN
Start: 2020-06-20 — End: 2020-06-20
  Administered 2020-06-20: 30 mL via TOPICAL

## 2020-06-20 MED ORDER — PROPOFOL 10 MG/ML IV EMUL (WRAP)
INTRAVENOUS | Status: AC
Start: 2020-06-20 — End: ?
  Filled 2020-06-20: qty 100

## 2020-06-20 MED ORDER — HYDROMORPHONE HCL 1 MG/ML IJ SOLN
INTRAMUSCULAR | Status: AC
Start: 2020-06-20 — End: ?
  Filled 2020-06-20: qty 1

## 2020-06-20 MED ORDER — PROPOFOL INFUSION 10 MG/ML
INTRAVENOUS | Status: DC | PRN
Start: 2020-06-20 — End: 2020-06-20
  Administered 2020-06-20: 175 ug/kg/min via INTRAVENOUS

## 2020-06-20 MED ORDER — HYDROMORPHONE HCL 1 MG/ML IJ SOLN
INTRAMUSCULAR | Status: DC | PRN
Start: 2020-06-20 — End: 2020-06-20
  Administered 2020-06-20: 1 mg via INTRAVENOUS

## 2020-06-20 MED ORDER — LACTATED RINGERS IV SOLN
INTRAVENOUS | Status: DC
Start: 2020-06-20 — End: 2020-06-21

## 2020-06-20 MED ORDER — FENTANYL CITRATE (PF) 50 MCG/ML IJ SOLN (WRAP)
INTRAMUSCULAR | Status: AC
Start: 2020-06-20 — End: 2020-06-20
  Administered 2020-06-20: 17:00:00 25 ug via INTRAVENOUS
  Filled 2020-06-20: qty 2

## 2020-06-20 MED ORDER — KETAMINE HCL 50 MG/ML IJ SOLN
INTRAMUSCULAR | Status: AC
Start: 2020-06-20 — End: ?
  Filled 2020-06-20: qty 10

## 2020-06-20 MED ORDER — MIDAZOLAM HCL 1 MG/ML IJ SOLN (WRAP)
INTRAMUSCULAR | Status: DC | PRN
Start: 2020-06-20 — End: 2020-06-20
  Administered 2020-06-20: 2 mg via INTRAVENOUS

## 2020-06-20 MED ORDER — GLYCOPYRROLATE 0.2 MG/ML IJ SOLN (WRAP)
INTRAMUSCULAR | Status: AC
Start: 2020-06-20 — End: ?
  Filled 2020-06-20: qty 1

## 2020-06-20 MED ORDER — PHENYLEPHRINE 100 MCG/ML IV SOSY (WRAP)
PREFILLED_SYRINGE | INTRAVENOUS | Status: AC
Start: 2020-06-20 — End: ?
  Filled 2020-06-20: qty 10

## 2020-06-20 MED ORDER — LIDOCAINE-EPINEPHRINE 1 %-1:100000 IJ SOLN
INTRAMUSCULAR | Status: DC | PRN
Start: 2020-06-20 — End: 2020-06-20
  Administered 2020-06-20: 10 mL
  Administered 2020-06-20: 17 mL
  Administered 2020-06-20: 30 mL
  Administered 2020-06-20: 9 mL
  Administered 2020-06-20: 11 mL

## 2020-06-20 MED ORDER — LIDOCAINE HCL 2 % IJ SOLN
INTRAMUSCULAR | Status: DC | PRN
Start: 2020-06-20 — End: 2020-06-20
  Administered 2020-06-20: 80 mg via INTRAVENOUS

## 2020-06-20 MED ORDER — ONDANSETRON HCL 4 MG/2ML IJ SOLN
INTRAMUSCULAR | Status: DC | PRN
Start: 2020-06-20 — End: 2020-06-20
  Administered 2020-06-20 (×2): 4 mg via INTRAVENOUS

## 2020-06-20 MED ORDER — ONDANSETRON HCL 4 MG/2ML IJ SOLN
INTRAMUSCULAR | Status: AC
Start: 2020-06-20 — End: ?
  Filled 2020-06-20: qty 2

## 2020-06-20 MED ORDER — NEOSTIGMINE METHYLSULFATE 1 MG/ML IJ/IV SOLN (WRAP)
Status: DC | PRN
Start: 2020-06-20 — End: 2020-06-20
  Administered 2020-06-20: 4 mg via INTRAVENOUS

## 2020-06-20 MED ORDER — SODIUM CHLORIDE 0.9 % IV SOLN
INTRAVENOUS | Status: DC | PRN
Start: 2020-06-20 — End: 2020-06-20
  Administered 2020-06-20: 09:00:00 30 ug/min via INTRAVENOUS

## 2020-06-20 MED ORDER — HYDROMORPHONE HCL 0.5 MG/0.5 ML IJ SOLN
0.5000 mg | INTRAMUSCULAR | Status: DC | PRN
Start: 2020-06-20 — End: 2020-06-20

## 2020-06-20 MED ORDER — LIDOCAINE HCL 1 % IJ SOLN
1.0000 mL | Freq: Once | INTRAMUSCULAR | Status: DC | PRN
Start: 2020-06-20 — End: 2020-06-20

## 2020-06-20 MED ORDER — CHLORHEXIDINE GLUCONATE 0.12 % MT SOLN
10.0000 mL | Freq: Two times a day (BID) | OROMUCOSAL | Status: DC
Start: 2020-06-20 — End: 2020-06-21
  Administered 2020-06-20: 21:00:00 10 mL via OROMUCOSAL
  Filled 2020-06-20 (×2): qty 15

## 2020-06-20 MED ORDER — DEXAMETHASONE SODIUM PHOSPHATE 4 MG/ML IJ SOLN (WRAP)
INTRAMUSCULAR | Status: DC | PRN
Start: 2020-06-20 — End: 2020-06-20
  Administered 2020-06-20 (×2): 4 mg via INTRAVENOUS

## 2020-06-20 MED ORDER — FENTANYL CITRATE (PF) 50 MCG/ML IJ SOLN (WRAP)
INTRAMUSCULAR | Status: DC | PRN
Start: 2020-06-20 — End: 2020-06-20
  Administered 2020-06-20: 100 ug via INTRAVENOUS
  Administered 2020-06-20 (×3): 25 ug via INTRAVENOUS

## 2020-06-20 MED ORDER — DEXAMETHASONE SODIUM PHOSPHATE 20 MG/5ML IJ SOLN
INTRAMUSCULAR | Status: AC
Start: 2020-06-20 — End: ?
  Filled 2020-06-20: qty 5

## 2020-06-20 MED ORDER — OXYCODONE HCL 5 MG PO TABS
5.0000 mg | ORAL_TABLET | Freq: Once | ORAL | Status: AC | PRN
Start: 2020-06-20 — End: 2020-06-20

## 2020-06-20 MED ORDER — ROCURONIUM BROMIDE 10 MG/ML IV SOLN (WRAP)
INTRAVENOUS | Status: DC | PRN
Start: 2020-06-20 — End: 2020-06-20
  Administered 2020-06-20 (×2): 20 mg via INTRAVENOUS
  Administered 2020-06-20: 30 mg via INTRAVENOUS
  Administered 2020-06-20: 20 mg via INTRAVENOUS
  Administered 2020-06-20: 10 mg via INTRAVENOUS
  Administered 2020-06-20 (×2): 20 mg via INTRAVENOUS
  Administered 2020-06-20: 10 mg via INTRAVENOUS

## 2020-06-20 MED ORDER — STERILE WATER FOR IRRIGATION IR SOLN
Status: DC | PRN
Start: 2020-06-20 — End: 2020-06-20
  Administered 2020-06-20: 3000 mL

## 2020-06-20 MED ORDER — PHENYLEPHRINE HCL 10 MG/ML IV SOLN (WRAP)
Status: DC | PRN
Start: 2020-06-20 — End: 2020-06-20
  Administered 2020-06-20: 150 ug via INTRAVENOUS

## 2020-06-20 MED ORDER — OXYCODONE HCL 5 MG PO TABS
ORAL_TABLET | ORAL | Status: AC
Start: 2020-06-20 — End: 2020-06-20
  Administered 2020-06-20: 18:00:00 5 mg via ORAL
  Filled 2020-06-20: qty 1

## 2020-06-20 MED ORDER — OXYCODONE HCL 5 MG PO TABS
5.0000 mg | ORAL_TABLET | ORAL | Status: DC | PRN
Start: 2020-06-20 — End: 2020-06-20
  Filled 2020-06-20: qty 1

## 2020-06-20 MED ORDER — MAGNESIUM SULFATE IN D5W 1-5 GM/100ML-% IV SOLN
INTRAVENOUS | Status: AC
Start: 2020-06-20 — End: ?
  Filled 2020-06-20: qty 200

## 2020-06-20 MED ORDER — FAMOTIDINE 10 MG/ML IV SOLN (WRAP)
INTRAVENOUS | Status: DC | PRN
Start: 2020-06-20 — End: 2020-06-20
  Administered 2020-06-20: 20 mg via INTRAVENOUS

## 2020-06-20 MED ORDER — OXYCODONE HCL 5 MG PO TABS
5.0000 mg | ORAL_TABLET | ORAL | Status: DC
Start: 2020-06-20 — End: 2020-06-21
  Administered 2020-06-20 – 2020-06-21 (×3): 5 mg via ORAL
  Filled 2020-06-20 (×3): qty 1

## 2020-06-20 MED ORDER — AMOXICILLIN-POT CLAVULANATE 875-125 MG PO TABS
1.0000 | ORAL_TABLET | Freq: Two times a day (BID) | ORAL | Status: DC
Start: 2020-06-20 — End: 2020-06-21
  Administered 2020-06-20 – 2020-06-21 (×2): 1 via ORAL
  Filled 2020-06-20 (×2): qty 1

## 2020-06-20 MED ORDER — HYDROMORPHONE HCL 0.5 MG/0.5 ML IJ SOLN
0.2000 mg | INTRAMUSCULAR | Status: DC | PRN
Start: 2020-06-20 — End: 2020-06-21
  Administered 2020-06-20: 0.2 mg via INTRAVENOUS
  Filled 2020-06-20: qty 1

## 2020-06-20 SURGICAL SUPPLY — 214 items
5% BATADINE STRL OPHT PRE SLTN (Solution) ×2
ADHESIVE LIQUID WATERPROOF VIAL PREP NONSTAIN MASTISOL STYRAX GUM (Skin Closure) ×3 IMPLANT
ADHESIVE LQ STYRAX GUM MASTIC ALC MTHY (Skin Closure) ×3
ADHESIVE SKIN CLOSURE DERMABOND ADVANCED (Skin Closure) ×3
ADHESIVE SKIN CLOSURE DERMABOND ADVANCED .7 ML LIQUID APPLICATOR (Skin Closure) ×3 IMPLANT
ADHESIVE SKIN DERMABOND ADV (Skin Closure) ×1
APPLIER INTERNAL CLIP SMALL L9 IN (Clips) ×3
APPLIER INTERNAL CLIP SMALL L9 IN TITANIUM 20 CLIP PREMIUM SURGICLIP (Clips) ×3 IMPLANT
APPLIER SURGICLIP III SM 9IN (Clips) ×1
BANDAGE ACE NONSTERILE 4IN LF (Bandage) ×1
BANDAGE BULKEE II GAUZE 3.4IN (Bandage) ×1
BANDAGE CMPR CFLX NL 5YDX4IN LF STRL (Procedure Accessories) ×6
BANDAGE COFLEX NL COMPRESSION L5 YD X W4 IN COHESIVE SOFT FOAM TAN (Procedure Accessories) ×6 IMPLANT
BANDAGE COMPRESSION L5 YD X W4 IN ELASTIC HOOK LOOP CLOSURE STRETCH (Bandage) ×3 IMPLANT
BANDAGE GAUZE L3.6 YD X W3.4 IN 6 PLY ABSORBENT STRETCH TIGHT FINISH (Bandage) ×3 IMPLANT
BANDAGE KERLIX MEDIUM GAUZE L3.6 YD X (Dressing) ×6 IMPLANT
BANDAGE MEDLINE COMPRESSION L5 YD X W4 (Bandage) ×3
BANDAGE MEDLINE GAUZE L3.6 YD X W3.4 IN (Bandage) ×3
BANDAGE STERI-STRIP 0.5X4IN (Dressing) ×1
BIT DRILL L67 MM OD2 MM NA WIRE PASS (Drillbits) ×6 IMPLANT
BIT DRILL L67 MM OD2 MM WIRE PASS (Drillbits) ×6
BLADE 10 BARD-PARKER RIB-BACK CARBON (Blade) ×6
BLADE 10 BARD-PARKER RIB-BACK CARBON STEEL TISSUE SURGICAL (Blade) ×6 IMPLANT
BLADE 15 CLASSIC CARBON STEEL TISSUE (Blade) ×6
BLADE 15 CLASSIC CARBON STEEL TISSUE SURGICAL (Blade) ×6 IMPLANT
BLADE NEPTUNE ELECTRODE 70MM (Blade) ×1
BLADE OPHTHALMIC STAB INCISION 15 D D5 (Blade) ×6
BLADE OPHTHALMIC STAB INCISION 15 D D5 MM BEAVER MICRO-SHARP GREEN (Blade) ×6 IMPLANT
BLADE RASP LRG TEAR CROSS CUT (Blade) ×1
BLADE S/SU RIBBACK CARB STL 15 (Blade) ×2
BLADE SHARP 15 DEG (Blade) ×2
BLADE SURG 10 (Blade) ×2
BULB DRAINAGE LIGHTWEIGHT LOW LEVEL (Drain) ×3
BULB DRAINAGE LIGHTWEIGHT LOW LEVEL SUCTION RELIAVAC SILICONE 100 CC (Drain) ×3 IMPLANT
BUR 4.0MM ROUND DIAMOND MED (Burr) ×1
BUR 5.0MM ROUND CARBIDE (Burr) ×2
BUR DIAMOND ROUND 5MM (Blade) ×2
BURR CARBIDE ROUND OD5 MM L44.5 MM (Burr) ×6
BURR CARBIDE ROUND OD5 MM L44.5 MM SURGICAL NA (Burr) ×6 IMPLANT
BURR DIAMOND OD5 MM L55 MM SURGICAL (Blade) ×6
BURR DIAMOND OD5 MM L55 MM SURGICAL ROUND COARSE BRANDX (Blade) ×6 IMPLANT
BURR DIAMOND ROUND OD4 MM L54.5 MM (Burr) ×3
BURR DIAMOND ROUND OD4 MM L54.5 MM SURGICAL MEDIUM GRIT NA (Burr) ×3 IMPLANT
CATH FOLEY TEMP 14F (Catheter Urine) ×1
CATHETER URETHRAL OD14 FR FOLEY (Catheter Urine) ×3
CATHETER URETHRAL OD14 FR FOLEY TEMPERATURE SENSE SIDE ARM THERMISTOR (Catheter Urine) ×3 IMPLANT
COMB 7IN BLK (Patient Supply) ×1
COMB HAIR PLASTIC FIRM FLEXIBLE TEETH (Patient Supply) ×3
COMB HAIR PLASTIC FIRM FLEXIBLE TEETH BLACK ADULT L7 IN MEDLINE (Patient Supply) ×3 IMPLANT
CORD BIPOLAR STRL DISP 12FT (Cautery) ×1
CORD ELECTROSURGICAL GREEN 12FT PLASTIC BIPOLAR STERILE DISPOSABLE (Cautery) ×3 IMPLANT
CORD ESURG PLS 12FT STRL BP DISP GRN (Cautery) ×3
DRAIN PENROSE 12X.25 STERILE (Drain) ×4 IMPLANT
DRAN EVACUATOR WOUND 100CC (Drain) ×1
DRAPE 66X44IN SLUSH WARMER PLATE EQUIPMENT ORS HUSH SLUSH STERILE (Drape) ×3 IMPLANT
DRAPE EQP ORS HSLSH 66X44IN STRL SLSH (Drape) ×3
DRAPE SLUSH MACHINE 66 X 44 (Drape) ×1
DRAPE SRG CNVRT 44X40IN LF STRL FLTR (Drape) ×3
DRAPE SRGCL REINFORCE SPLIT ABSRBNT TUBE HOLDER 120X77IN TIBURON (Drape) IMPLANT
DRAPE SURGICAL FANFOLD SHEET L85 IN X (Drape) ×3
DRAPE SURGICAL FANFOLD SHEET L85 IN X W70 IN MEDLINE SMS LARGE (Drape) ×3 IMPLANT
DRAPE SURGICAL FILTER SCREEN FLUID CONTROL POUCH DRAINAGE PORT L44 IN (Drape) ×3 IMPLANT
DRAPE SURGICAL LARGE (Drape) ×1
DRAPE SURGICAL REINFORCE SPLIT ABSORBENT (Drape)
DRAPE UNDER BUTTOCKS W/FLUID (Drape) ×1
DRESSING TELFA 3X8IN STERILE (Dressing) ×4 IMPLANT
DRESSING TELFA STERILE 3X4 (Dressing)
DRESSING WND PLSTR CTTN TELFA 4X3IN LF (Dressing) IMPLANT
DRILL BIT 2.0MM WIRE PASS (Drillbits) ×2
ELECTRODE ADULT PATIENT RETURN L9 FT REM POLYHESIVE ACRYLIC FOAM (Procedure Accessories) ×3 IMPLANT
ELECTRODE ELECTROSRGCL NDL L2.84 IN OD3/32 IN EDGE L.2 IN INSLTE SHFT (Cautery) ×3 IMPLANT
ELECTRODE ELECTROSURGICAL BLADE L70 MM (Blade) ×3
ELECTRODE ELECTROSURGICAL BLADE L70 MM NEPTUNE E-SEP INSULATE COATED (Blade) ×3 IMPLANT
ELECTRODE ELECTROSURGICAL NEEDLE L2.84 (Cautery) ×3
ELECTRODE NEEDLE TIP INSULATED (Cautery) ×1
ELECTRODE PATIENT RETURN L9 FT VALLEYLAB (Procedure Accessories) ×3
FORCEPS BIPLR BAYONET STRT 8IN (Procedure Accessories)
FORCEPS ELECTROSURGICAL L8 1/4 IN (Procedure Accessories)
FORCEPS ELECTROSURGICAL L8 1/4 IN BAYONET BIPOLAR SMOOTH STRAIGHT TIP (Procedure Accessories) IMPLANT
GAUZE KRLX STRL 3.4INX3.6YRD (Dressing) ×2
GLOVE 7.5 PI ULTRA TOUCH (Glove) ×2
GLOVE SURGICAL 7 1/2 BIOGEL PI (Glove) ×6
GLOVE SURGICAL 7 1/2 BIOGEL PI INDICATOR (Glove) ×3
GLOVE SURGICAL 7 1/2 BIOGEL PI INDICATOR UNDERGLOVE POWDER FREE SMOOTH (Glove) ×3 IMPLANT
GLOVE SURGICAL 7 1/2 BIOGEL PI ULTRATOUCH G POWDER FREE ROUGH BEAD (Glove) ×6 IMPLANT
GLOVE SURGICAL 7.5 BIOGEL PI MICRO (Glove) ×6
GLOVE SURGICAL 7.5 BIOGEL PI MICRO POWDER FREE ROUGH BEAD CUFF (Glove) ×6 IMPLANT
GLOVES BIOGEL PI MICRO SZ 7.5 (Glove) ×2
GOWN SILK SURGICAL LARGE (Gown) ×1
GOWN SMART XLG (Gown) ×2
GOWN SURGICAL LARGE STANDARD FABRIC (Gown) ×3
GOWN SURGICAL LARGE STANDARD FABRIC ROYALSILK LEVEL 3 NONREINFORCE SET (Gown) ×3 IMPLANT
GOWN SURGICAL XL SMARTGOWN LEVEL 4 (Gown) ×6
GOWN SURGICAL XL SMARTGOWN LEVEL 4 BREATHABLE (Gown) ×6 IMPLANT
KIT STAPLE REMOVER L3 IN X W3 IN 4 IN 12 (Procedure Accessories)
KIT STAPLE REMOVER L3 IN X W3 IN 4 IN 12 PLY SPONGE BASIC GAUZE (Procedure Accessories) IMPLANT
MARKER SKIN FINE TIP W/RULER (Other)
MARKER SKIN L6 IN RULER LARGE RESERVOIR (Other)
MARKER SKIN L6 IN RULER LARGE RESERVOIR FINE TIP 6 LABEL GENTIAN (Other) IMPLANT
MASTISOL VIAL 2/3CC STRL (Skin Closure) ×1
MAT ABSORBENT (Procedure Accessories) ×4 IMPLANT
NEEDLE HPO PP RW BD 22GA 1.5IN LF STRL (Needles)
NEEDLE HPO SS PP THNWL BD 19GA 1.5IN LF (Needles) ×3 IMPLANT
NEEDLE HYPODERMIC L1 1/2 IN OD22 GA REGULAR WALL BD POLYPROPYLENE (Needles) IMPLANT
NEEDLE REG BEVEL 19GX1.5IN (Needles) ×1
NEEDLE YALE SHRTBV 22GX1.5 DSP (Needles)
PAD ELECTROSRG GRND REM W CRD (Procedure Accessories) ×1
PENCIL SMOKE EVAC 70MM PSH BTN (Cautery) ×1
PENCIL SMOKE EVACUATOR COATED PUSH (Cautery) ×3
PENCIL SMOKE EVACUATOR COATED PUSH BUTTON NEPTUNE E-SEP (Cautery) ×3 IMPLANT
RASP L14 MM X W7 MM LARGE TPS STAINLESS (Blade) ×3
RASP L14 MM X W7 MM LARGE TPS STAINLESS STEEL SURGICAL TEAR CROSS CUT (Blade) ×3 IMPLANT
REAGENT PROBE SOLUTION ARCHITECT I2000 ARCHITECT 25ML 01L5640 (Procedure Accessories) ×3 IMPLANT
REMOVER STAPLE SKIN STERILE (Procedure Accessories)
SHAMPOO BABY JOHNSONS HEAD-TO-TOE (Patient Supply) ×3
SHAMPOO BABY JOHNSONS HEAD-TO-TOE PEDIATRIC 1.7 OZ (Patient Supply) ×3 IMPLANT
SHEAR HARMONIC FOCUS+ CRVD 9CM (Cautery) ×1
SHEARS ELECTROSURGICAL L9 CM CURVE TAPER (Cautery) ×3
SHEARS ELECTROSURGICAL L9 CM CURVE TAPER 2 HANDCONTROL SOFT GRIP (Cautery) ×3 IMPLANT
SHEET SPLIT (Drape)
SOLUTION BETADINE 4OZ (Scrub Supplies) ×1
SOLUTION BETADINE 8OZ (Prep) ×1
SOLUTION BSS IRR STRL 15ML (Irrigation Solutions) ×1
SOLUTION IRRIGATION 0.9% BALANCED SALT (Irrigation Solutions) ×3
SOLUTION IRRIGATION 0.9% BALANCED SALT 15 ML (Irrigation Solutions) ×3 IMPLANT
SOLUTION OPHTHALMIC PREP HDPE BOTTLE (Solution) ×6
SOLUTION OPHTHALMIC PREP HDPE BOTTLE BETADINE 5% PVP IODINE 30 ML (Solution) ×6 IMPLANT
SOLUTION PREP BETADINE 10% PVP IODINE 8OZ BOTTLE SKIN (Prep) ×3 IMPLANT
SOLUTION PROBE CONDITIONER (Procedure Accessories) ×4
SOLUTION PRP 10% PVP IOD 8OZ BDINE BTL (Prep) ×3
SOLUTION SRGSCRB 10% PVP IOD 4OZ PLS BTL (Scrub Supplies) ×3
SOLUTION SURGICAL SCRUB 10% PVP IODINE 4OZ PLASTIC BOTTLE AQUEOUS SKIN (Scrub Supplies) ×3 IMPLANT
SPONGE GAUZE 12PLY STRL 4X4IN (Sponge) ×2
SPONGE GAUZE 16PLY STRL 4X4 (Sponge) ×2
SPONGE GAUZE L4 IN X W4 IN 12 PLY CURITY (Sponge) ×6 IMPLANT
SPONGE GAUZE L4 IN X W4 IN 16 PLY (Sponge) ×6
SPONGE GAUZE L4 IN X W4 IN 16 PLY MAXIMUM ABSORBENT TRAY CURITY (Sponge) ×6 IMPLANT
SPONGE LAPAROTOMY 18X18IN (Sponge) ×1
SPONGE LAPAROTOMY L18 IN X W18 IN 4 PLY (Sponge) ×3
SPONGE LAPAROTOMY L18 IN X W18 IN 4 PLY RADIOPAQUE PYRONEMA FREE HIGH (Sponge) ×3 IMPLANT
SPONGE PEANUT C5 HOLDER DISSECTOR XRAY (Sponge) ×6
SPONGE PEANUT C5 HOLDER DISSECTOR XRAY DETECTABLE OD3/8 IN DUKAL (Sponge) ×6 IMPLANT
SPONGE PEANUT RADPQE STRL3/8IN (Sponge) ×2
STAPLER SKIN PREMIUM 35W (Staplers) ×2
STAPLER SKIN W4.8 MM X H3.4 MM 35 WIDE (Staplers) ×6 IMPLANT
STRIP SKIN CLOSURE L4 IN X W1/2 IN (Dressing) ×3
STRIP SKIN CLOSURE L4 IN X W1/2 IN REINFORCE STERI-STRIP POLYESTER (Dressing) ×3 IMPLANT
SUT ETHILON 4-0 PS2 18IN (Suture) ×2
SUTURE CHROMIC 3-0 RB1 27IN (Suture)
SUTURE CHROMIC 3-0 SH 27IN (Suture) ×4
SUTURE CHROMIC 4-0 RB1 27IN (Suture) ×2
SUTURE CHROMIC 5-0 P3 18IN (Suture) ×2
SUTURE CHROMIC GUT CHROMIC 3-0 RB-1 L27 (Suture)
SUTURE CHROMIC GUT CHROMIC 3-0 RB-1 L27 IN MONOFILAMENT BROWN (Suture) IMPLANT
SUTURE CHROMIC GUT CHROMIC 3-0 SH L27 IN (Suture) ×12
SUTURE CHROMIC GUT CHROMIC 3-0 SH L27 IN MONOFILAMENT BROWN ABSORBABLE (Suture) ×12 IMPLANT
SUTURE CHROMIC GUT CHROMIC 4-0 RB-1 L27 (Suture) ×6
SUTURE CHROMIC GUT CHROMIC 4-0 RB-1 L27 IN MONOFILAMENT BROWN (Suture) ×6 IMPLANT
SUTURE CHROMIC GUT CHROMIC 5-0 P-3 L18 (Suture) ×6 IMPLANT
SUTURE COATED VICRYL 4-0 RB-1 L27 IN (Suture) ×9
SUTURE COATED VICRYL 4-0 RB-1 L27 IN BRAID COATED UNDYED ABSORBABLE (Suture) ×9 IMPLANT
SUTURE COATED VICRYL 5-0 RB-1 L27 IN (Suture) ×3
SUTURE COATED VICRYL 5-0 RB-1 L27 IN BRAID COATED UNDYED ABSORBABLE (Suture) ×3 IMPLANT
SUTURE ETHILON 3-0 PS1 18IN (Suture) ×1
SUTURE ETHILON BLACK 3-0 PS-1 L18 IN (Suture) ×3
SUTURE ETHILON BLACK 3-0 PS-1 L18 IN MONOFILAMENT NONABSORBABLE (Suture) ×3 IMPLANT
SUTURE ETHILON BLACK 4-0 PS-2 L19 IN (Suture) ×6
SUTURE ETHILON BLACK 4-0 PS-2 L19 IN MONOFILAMENT NONABSORBABLE (Suture) ×6 IMPLANT
SUTURE MONOCRYL 4-0 P-3 L18 IN (Suture) ×3
SUTURE MONOCRYL 4-0 P-3 L18 IN MONOFILAMENT UNDYED ABSORBABLE (Suture) ×3 IMPLANT
SUTURE MONOCRYL 4-0 P3 18IN (Suture) ×1
SUTURE MONOCRYL 4-0 PS-2 L18 IN (Suture) ×12
SUTURE MONOCRYL 4-0 PS-2 L18 IN MONOFILAMENT UNDYED ABSORBABLE (Suture) ×12 IMPLANT
SUTURE MONOCRYL 4-0 PS2 18 IN (Suture) ×4
SUTURE PDS II 5-0 RB-1 L27 IN (Suture) ×6
SUTURE PDS II 5-0 RB-1 L27 IN MONOFILAMENT VIOLET ABSORBABLE (Suture) ×6 IMPLANT
SUTURE PDS II 5-0 RB1 27IN (Suture) ×2
SUTURE PROLENE 3-0 SH 30IN (Suture)
SUTURE PROLENE 5-0 P3 18IN (Suture) ×2
SUTURE PROLENE BLUE 3-0 SH L30 IN (Suture)
SUTURE PROLENE BLUE 3-0 SH L30 IN MONOFILAMENT NONABSORBABLE (Suture) IMPLANT
SUTURE PROLENE BLUE 5-0 P-3 L18 IN (Suture) ×6
SUTURE PROLENE BLUE 5-0 P-3 L18 IN MONOFILAMENT NONABSORBABLE (Suture) ×6 IMPLANT
SUTURE SILK 2-0 18IN (Suture) ×1
SUTURE SILK 2-0 SH 30IN (Suture) ×2
SUTURE SILK 3-0 18IN (Suture) ×1
SUTURE SILK 3-0 RB1 30IN (Suture) ×2
SUTURE SILK PERMA HAND BLACK 2-0 L18 IN (Suture) ×3
SUTURE SILK PERMA HAND BLACK 2-0 L18 IN BRAID TIES 12 STRAND PRECUT (Suture) ×3 IMPLANT
SUTURE SILK PERMA HAND BLACK 2-0 SH L30 (Suture) ×6
SUTURE SILK PERMA HAND BLACK 2-0 SH L30 IN BRAID NONABSORBABLE (Suture) ×6 IMPLANT
SUTURE SILK PERMA HAND BLACK 3-0 L18 IN (Suture) ×3
SUTURE SILK PERMA HAND BLACK 3-0 L18 IN BRAID TIES 12 STRAND PRECUT (Suture) ×3 IMPLANT
SUTURE SILK PERMA HAND BLACK 3-0 RB-1 (Suture) ×6
SUTURE SILK PERMA HAND BLACK 3-0 RB-1 L30 IN BRAID NONABSORBABLE (Suture) ×6 IMPLANT
SUTURE VICRYL 4-0 RB1 27IN (Suture) ×3
SUTURE VICRYL 5-0 RB1 27IN UND (Suture) ×1
SYRINGE 10 ML CONTROL CONCENTRIC TIP (Syringes, Needles) ×12
SYRINGE 10 ML CONTROL CONCENTRIC TIP PYROGEN FREE DEHP FREE LOK (Syringes, Needles) ×12 IMPLANT
SYRINGE IRR BULB 60ML (Syringes, Needles) ×1
SYRINGE LUER-LOK CONTROL 10ML (Syringes, Needles) ×4
SYRINGE MEDLINE 60 ML LID SOFT BULB TIP (Syringes, Needles) ×3
SYRINGE MEDLINE 60 ML LID SOFT BULB TIP IRRIGATION TYVEK (Syringes, Needles) ×3 IMPLANT
TRAY NASAL ORAL HEAD NCK ~~LOC~~ (Pack) ×1
TRAY PREOPERATIVE SKIN DRY SCRUB (Tray) ×4 IMPLANT
TRAY SRG NASAL ORAL HEAD NCK ~~LOC~~ (Pack) ×3
TRAY SURGICAL MEDLINE INDUSTRIES, INC. NASAL ORAL HEAD NEACK (Pack) ×3 IMPLANT
TRAY SURGICAL NASAL ORAL HEAD NEACK (Pack) ×3 IMPLANT
TUBING CONNECTING STERILE 10FT (Tubing) ×1
TUBING SUCTION ID3/16 IN L10 FT (Tubing) ×3
TUBING SUCTION ID3/16 IN L10 FT NONCONDUCTIVE STRAIGHT MALE FEMALE (Tubing) ×3 IMPLANT
UNDRGLOV SZ7.5 PI INDICATR BLU (Glove) ×1
WASH BABY HEAD-TO-TOE (Patient Supply) ×1
WRAP COFLEX 4INX5YD NLTX STRL (Procedure Accessories) ×2

## 2020-06-20 NOTE — Transfer of Care (Signed)
Anesthesia Transfer of Care Note    Patient: [name]    Procedures performed: Procedure(s):  FEMMINIZING FOREHEAD CONTORING W/BROW LIFT, FEMINIZING LIF LIPT (MEDICAL)  W/SCALP ADVANCEMENT (MEDICAL)  FEMINIZING RHINOPLASTY (MEDICAL)  FEMINIZING  MANDIBLE CONTOURING (MEDICAL)  TRACH SHAVE (MEDICAL)    Anesthesia type: General ETT    Patient location:PACU    Last vitals:   Vitals:    06/20/20 1618   BP: 113/60   Pulse: 69   Resp: 14   Temp:    SpO2: 99%       Post pain: Patient not complaining of pain, continue current therapy      Mental Status:sedated and easily arousable    Respiratory Function: tolerating face mask    Cardiovascular: stable    Nausea/Vomiting: patient not complaining of nausea or vomiting    Hydration Status: adequate    Post assessment: no apparent anesthetic complications, no reportable events and no evidence of recall    Signed by: Nolon Bussing, CRNA  06/20/20 4:18 PM

## 2020-06-20 NOTE — Progress Notes (Signed)
Patient arrived to floor from PACU in stable condition. Patient reports general discomfort, but no current pain. Dressing in place to head, ice packs reapplied. Gauze dressing to nose replaced. Patient oriented to room, taught how to use room phone and call light. Patient educated on new medications, both scheduled and PRN. Patient has regular diet, able to tolerate water and applesauce. Patient due to void. Call light in reach, patient encouraged to call for any needed assistance.

## 2020-06-20 NOTE — Anesthesia Preprocedure Evaluation (Signed)
Anesthesia Evaluation    AIRWAY    Mallampati: I    TM distance: >3 FB  Neck ROM: full  Mouth Opening:full  Planned to use difficult airway equipment: No CARDIOVASCULAR    cardiovascular exam normal       DENTAL           PULMONARY    pulmonary exam normal     OTHER FINDINGS                  Relevant Problems   No relevant active problems               Anesthesia Plan    ASA 1     general                     intravenous induction   Detailed anesthesia plan: general endotracheal and general LMA        Post op pain management: per surgeon    informed consent obtained    Plan discussed with CRNA.      pertinent labs reviewed             Signed by: Marlowe Aschoff, MD 06/20/20 8:07 AM

## 2020-06-21 ENCOUNTER — Encounter: Payer: Self-pay | Admitting: Otolaryngology

## 2020-06-21 NOTE — Progress Notes (Signed)
Patient discharge instructions and education provided. Patient follow ups set. Medication given to patient. IV removed no complications. All questions answered. Patient verbalized understanding of all discharge instructions.

## 2020-06-21 NOTE — Discharge Summary (Signed)
Discharge Summary    Date:06/21/2020   Patient Name: Hayley Sanders  Attending Physician: Jodi Geralds, MD    Date of Admission:   06/20/2020    Date of Discharge:   06/21/20    Admitting Diagnosis:   Gender dysphoria in adult    Discharge Dx:     Principal Diagnosis (Diagnosis after study, that is chiefly responsible for admission to inpatient status): Gender dysphoria in adult      Treatment Team:   Treatment Team:   Attending Provider: Jodi Geralds, MD     Procedures performed:   Surgery: all results from this admission  Procedure(s):  FEMMINIZING FOREHEAD CONTORING W/BROW LIFT, FEMINIZING LIF LIPT (MEDICAL) (N/A)  W/SCALP ADVANCEMENT (MEDICAL) (N/A)  FEMINIZING RHINOPLASTY (MEDICAL) (N/A)  FEMINIZING  MANDIBLE CONTOURING (MEDICAL) (N/A)  TRACH SHAVE (MEDICAL) (N/A)    Reason for Admission:   Facial feminization surgery    Hospital Course:   Patient did well post-operatively and was discharged the following day.    Condition at Discharge:   Stable    Today:     BP 126/83    Pulse 84    Temp 98.4 F (36.9 C) (Oral)    Resp 17    Ht 1.829 m (6' 0.01")    Wt 83.9 kg (185 lb)    SpO2 99%    BMI 25.08 kg/m   Ranges for the last 24 hours:  Temp:  [97.3 F (36.3 C)-99.3 F (37.4 C)] 98.4 F (36.9 C)  Heart Rate:  [66-99] 84  Resp Rate:  [13-18] 17  BP: (107-149)/(58-85) 126/83    Last set of labs   None    Micro / Labs / Path pending:     Unresulted Labs     None          Discharge Instructions For Providers     1. Please take medications (previously-picked up) as prescribed  2. Perform post-op care as described in the video recorded on AM of 1/27  3. Please follow-up with Dr. Salina April as previously-discussed             Discharge Instructions:       Discharge Diet: Regular Diet  Wound 06/20/20 Surgical Incision Neck Other (Comment) (Active)   Site Description Dressing covering site (UTA) 06/20/20 2020   Peri-wound Description Dressing covering site (UTA) 06/20/20 2020   Closure Dressing  covering site (UTA) 06/20/20 2020   Drainage Amount Dressing covering site (UTA) 06/20/20 2020   Dressing Ace Wrap 06/20/20 2020   Dressing Changed New 06/20/20 2020   Dressing Status Clean;Dry;Intact 06/20/20 2020   Number of days: 0       Wound 06/20/20 Surgical Incision Face Other (Comment) thermasplint (Active)   Site Description Dressing covering site (UTA) 06/20/20 2020   Peri-wound Description Dressing covering site (UTA) 06/20/20 2020   Closure Approximated, closed and dry 06/20/20 2020   Drainage Amount Small 06/20/20 2020   Drainage Description Sanguinous 06/20/20 2020   Dressing Splint;Silk Tape;Gauze 06/20/20 2020   Dressing Changed Changed 06/20/20 2020   Dressing Status Clean;Dry;Intact 06/20/20 2020   Number of days: 0       Wound 06/20/20 Surgical Incision Head Other (Comment) Bactroban ointment (Active)   Site Description Dressing covering site (UTA) 06/20/20 2020   Peri-wound Description Dressing covering site (UTA) 06/20/20 2020   Closure Dressing covering site (UTA) 06/20/20 2020   Drainage Amount Dressing covering site (UTA) 06/20/20 2020   Dressing Ace Wrap 06/20/20 2020  Dressing Changed New 06/20/20 2020   Dressing Status Clean;Dry;Intact 06/20/20 2020   Number of days: 0        Discharge References/Attachments    None         Disposition:        Discharge Medication List      Taking    buPROPion XL 150 MG 24 hr tablet  Commonly known as: WELLBUTRIN XL  Take by mouth daily with lunch     estradiol valerate 20 MG/ML injection  Commonly known as: DELESTROGEN  Inject into the muscle twice a week     Methylphenidate HCl ER 27 MG Tb24  Dose: 1 tablet  Take 1 tablet by mouth daily with lunch     Prometrium 100 MG Caps  Generic drug: Progesterone  Place rectally nightly          Minutes spent coordinating discharge and reviewing discharge plan: 10 minutes      Signed by: Melony Overly, MD

## 2020-06-21 NOTE — Discharge Instr - AVS First Page (Addendum)
Please refer to previously-given sheet of instructions and the video recorded on post-op day 1 for wound care instructions.    Please follow-up with Dr. Kuperstock in around 1 week  Please call with any questions you have

## 2020-06-21 NOTE — Plan of Care (Signed)
Alert and oriented, vitals stable, on room air. Regular diet, tolerating clear liquids and applesauce. Some swelling & bruising around eyes. Nasal passage covered with gauze and tape, scant sanguinous drainage. Ice packs applied to face as needed. Voiding. Ambulates standby assist. Pain controlled with scheduled oxy and tylenol. Call bell at bedside, SCDs applied, all fall and safety precautions implemented. Will continue with plan of care.      Problem: Safety  Goal: Patient will be free from injury during hospitalization  Outcome: Progressing  Flowsheets (Taken 06/21/2020 0517)  Patient will be free from injury during hospitalization:   Assess patient's risk for falls and implement fall prevention plan of care per policy   Provide and maintain safe environment   Use appropriate transfer methods   Include patient/ family/ care giver in decisions related to safety   Assess for patients risk for elopement and implement Elopement Risk Plan per policy  Goal: Patient will be free from infection during hospitalization  Outcome: Progressing  Flowsheets (Taken 06/21/2020 0517)  Free from Infection during hospitalization:   Assess and monitor for signs and symptoms of infection   Monitor lab/diagnostic results   Monitor all insertion sites (i.e. indwelling lines, tubes, urinary catheters, and drains)   Encourage patient and family to use good hand hygiene technique     Problem: Pain  Goal: Pain at adequate level as identified by patient  Outcome: Progressing  Flowsheets (Taken 06/21/2020 0517)  Pain at adequate level as identified by patient:   Identify patient comfort function goal   Assess for risk of opioid induced respiratory depression, including snoring/sleep apnea. Alert healthcare team of risk factors identified.   Assess pain on admission, during daily assessment and/or before any "as needed" intervention(s)   Reassess pain within 30-60 minutes of any procedure/intervention, per Pain Assessment,  Intervention, Reassessment (AIR) Cycle   Evaluate if patient comfort function goal is met   Evaluate patient's satisfaction with pain management progress   Offer non-pharmacological pain management interventions   Include patient/patient care companion in decisions related to pain management as needed     Problem: Side Effects from Pain Analgesia  Goal: Patient will experience minimal side effects of analgesic therapy  Outcome: Progressing  Flowsheets (Taken 06/21/2020 0517)  Patient will experience minimal side effects of analgesic therapy:   Monitor/assess patient's respiratory status (RR depth, effort, breath sounds)   Assess for changes in cognitive function   Prevent/manage side effects per LIP orders (i.e. nausea, vomiting, pruritus, constipation, urinary retention, etc.)   Evaluate for opioid-induced sedation with appropriate assessment tool (i.e. POSS)     Problem: Discharge Barriers  Goal: Patient will be discharged home or other facility with appropriate resources  Outcome: Progressing  Flowsheets (Taken 06/21/2020 0517)  Discharge to home or other facility with appropriate resources: Provide appropriate patient education     Problem: Psychosocial and Spiritual Needs  Goal: Demonstrates ability to cope with hospitalization/illness  Outcome: Progressing  Flowsheets (Taken 06/21/2020 0517)  Demonstrates ability to cope with hospitalizations/illness:   Encourage verbalization of feelings/concerns/expectations   Provide quiet environment   Assist patient to identify own strengths and abilities   Encourage patient to set small goals for self   Encourage participation in diversional activity   Include patient/ patient care companion in decisions     Problem: Moderate/High Fall Risk Score >5  Goal: Patient will remain free of falls  Outcome: Progressing  Flowsheets (Taken 06/21/2020 0000)  Moderate Risk (6-13):   MOD-Floor mat at bedside (where  available) if appropriate   MOD-Remain with patient  during toileting   MOD-Perform dangle, stand, walk (DSW) prior to mobilization

## 2020-06-24 NOTE — Anesthesia Postprocedure Evaluation (Signed)
Anesthesia Post Evaluation    Patient: Hayley Sanders    Procedure(s):  FEMMINIZING FOREHEAD CONTORING W/BROW LIFT, FEMINIZING LIF LIPT (MEDICAL)  W/SCALP ADVANCEMENT (MEDICAL)  FEMINIZING RHINOPLASTY (MEDICAL)  FEMINIZING  MANDIBLE CONTOURING (MEDICAL)  TRACH SHAVE (MEDICAL)    Anesthesia type: general    Last Vitals:   Vitals Value Taken Time   BP 142/74 06/20/20 1820   Temp 36.3 C (97.4 F) 06/20/20 1730   Pulse 80 06/20/20 1820   Resp 15 06/20/20 1820   SpO2 100 % 06/20/20 1820                 Anesthesia Post Evaluation:     Patient Evaluated: PACU  Patient Participation: complete - patient participated  Level of Consciousness: awake and alert  Pain Score: 2  Pain Management: adequate    Airway Patency: patent    Anesthetic complications: No      PONV Status: none    Cardiovascular status: acceptable  Respiratory status: acceptable  Hydration status: acceptable        Signed by: Marlowe Aschoff, MD, 06/24/2020 7:51 PM

## 2020-06-25 DIAGNOSIS — F4322 Adjustment disorder with anxiety: Secondary | ICD-10-CM | POA: Diagnosis not present

## 2020-07-05 DIAGNOSIS — F902 Attention-deficit hyperactivity disorder, combined type: Secondary | ICD-10-CM | POA: Diagnosis not present

## 2020-07-06 DIAGNOSIS — F4322 Adjustment disorder with anxiety: Secondary | ICD-10-CM | POA: Diagnosis not present

## 2020-07-12 DIAGNOSIS — E349 Endocrine disorder, unspecified: Secondary | ICD-10-CM | POA: Diagnosis not present

## 2020-07-12 DIAGNOSIS — Z1389 Encounter for screening for other disorder: Secondary | ICD-10-CM | POA: Diagnosis not present

## 2020-09-28 DIAGNOSIS — F902 Attention-deficit hyperactivity disorder, combined type: Secondary | ICD-10-CM | POA: Diagnosis not present

## 2020-12-21 DIAGNOSIS — F902 Attention-deficit hyperactivity disorder, combined type: Secondary | ICD-10-CM | POA: Diagnosis not present

## 2021-02-17 DIAGNOSIS — Z03818 Encounter for observation for suspected exposure to other biological agents ruled out: Secondary | ICD-10-CM | POA: Diagnosis not present

## 2021-02-17 DIAGNOSIS — Z20822 Contact with and (suspected) exposure to covid-19: Secondary | ICD-10-CM | POA: Diagnosis not present

## 2021-02-17 DIAGNOSIS — Z0372 Encounter for suspected placental problem ruled out: Secondary | ICD-10-CM | POA: Diagnosis not present

## 2021-03-01 IMAGING — DX DG HAND COMPLETE 3+V*L*
3 series · 3 of 3 positions shown · non-contrast
Comparison: None.

CLINICAL DATA: Pain

EXAM:
LEFT HAND - COMPLETE 3+ VIEW

[hand pa]
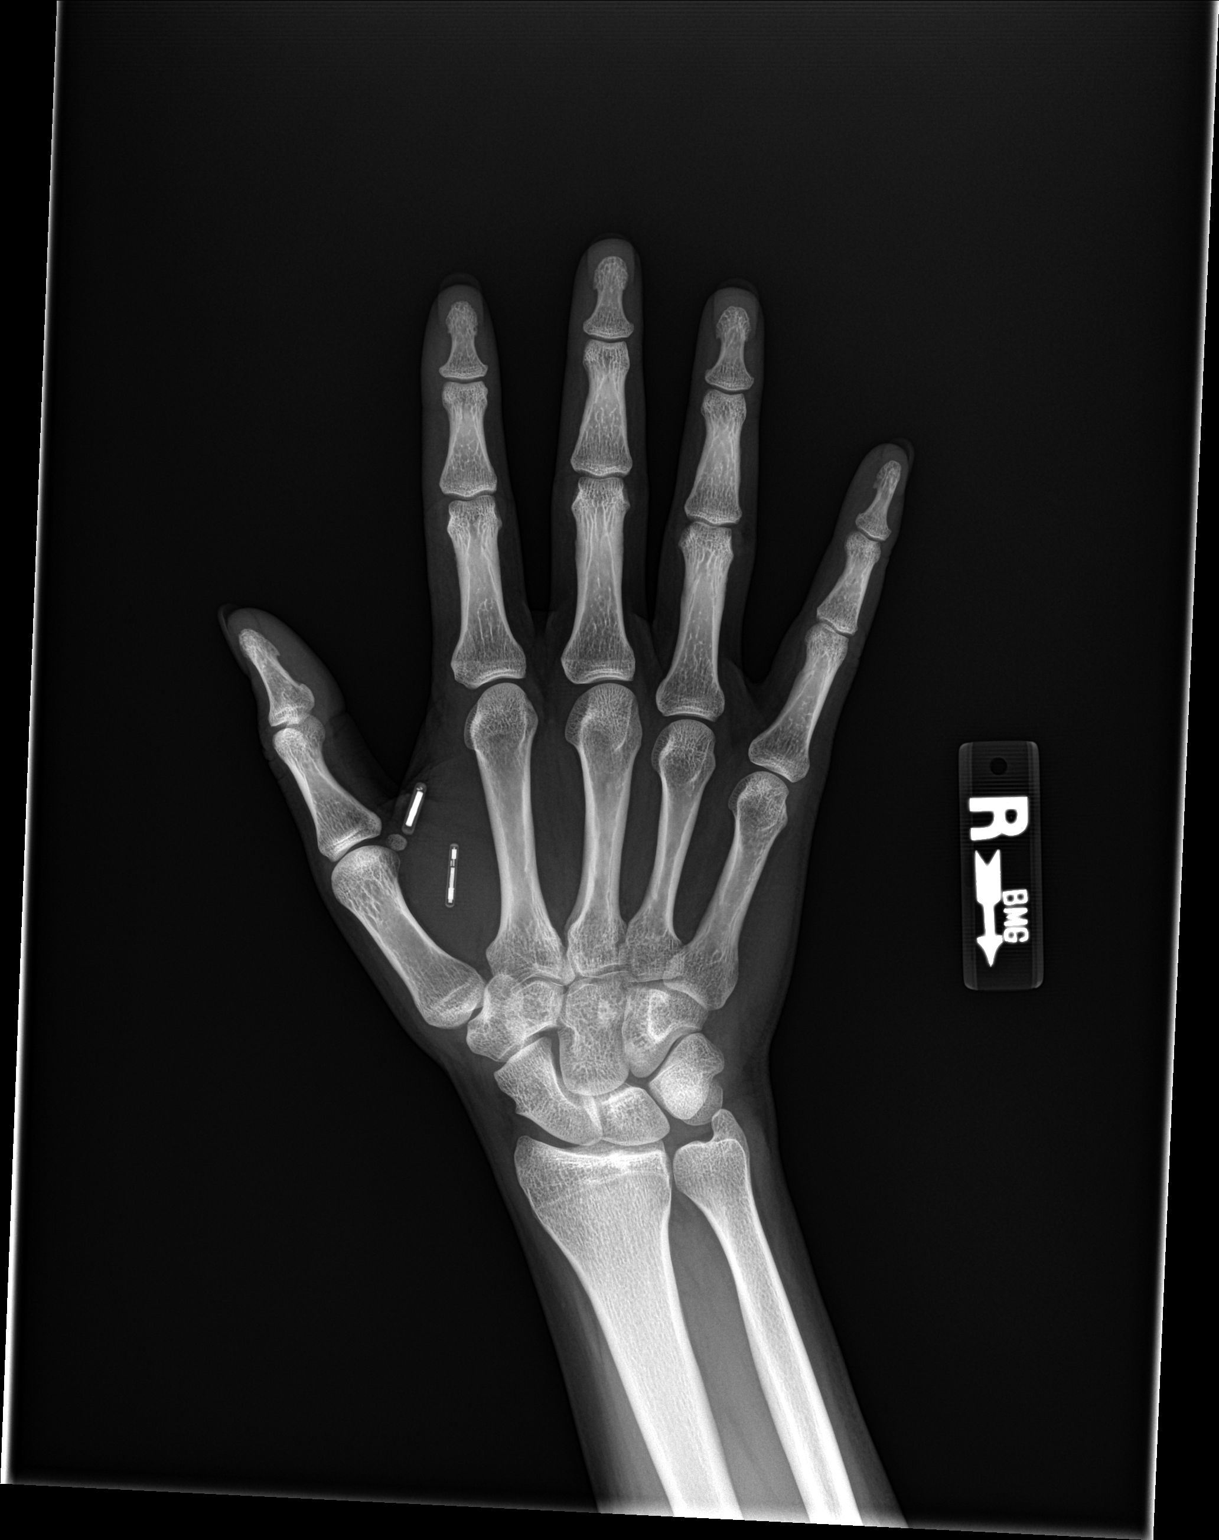

[hand obl]
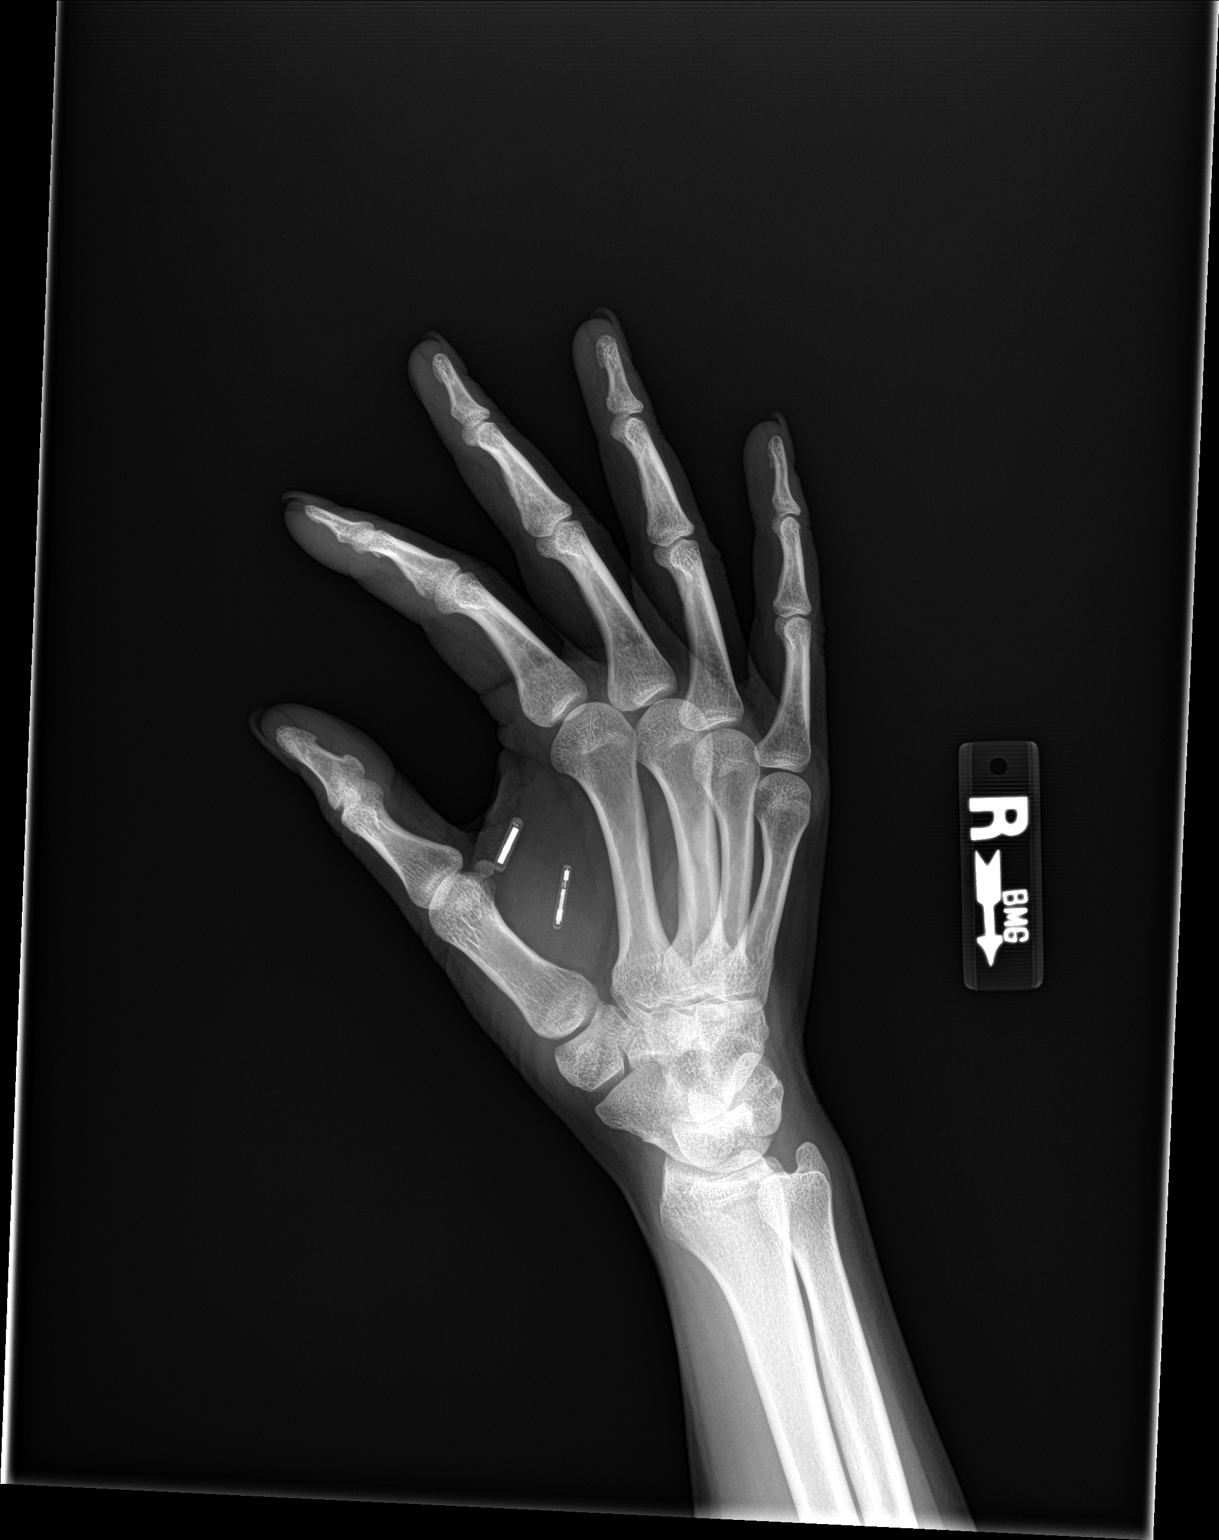

[hand lat]
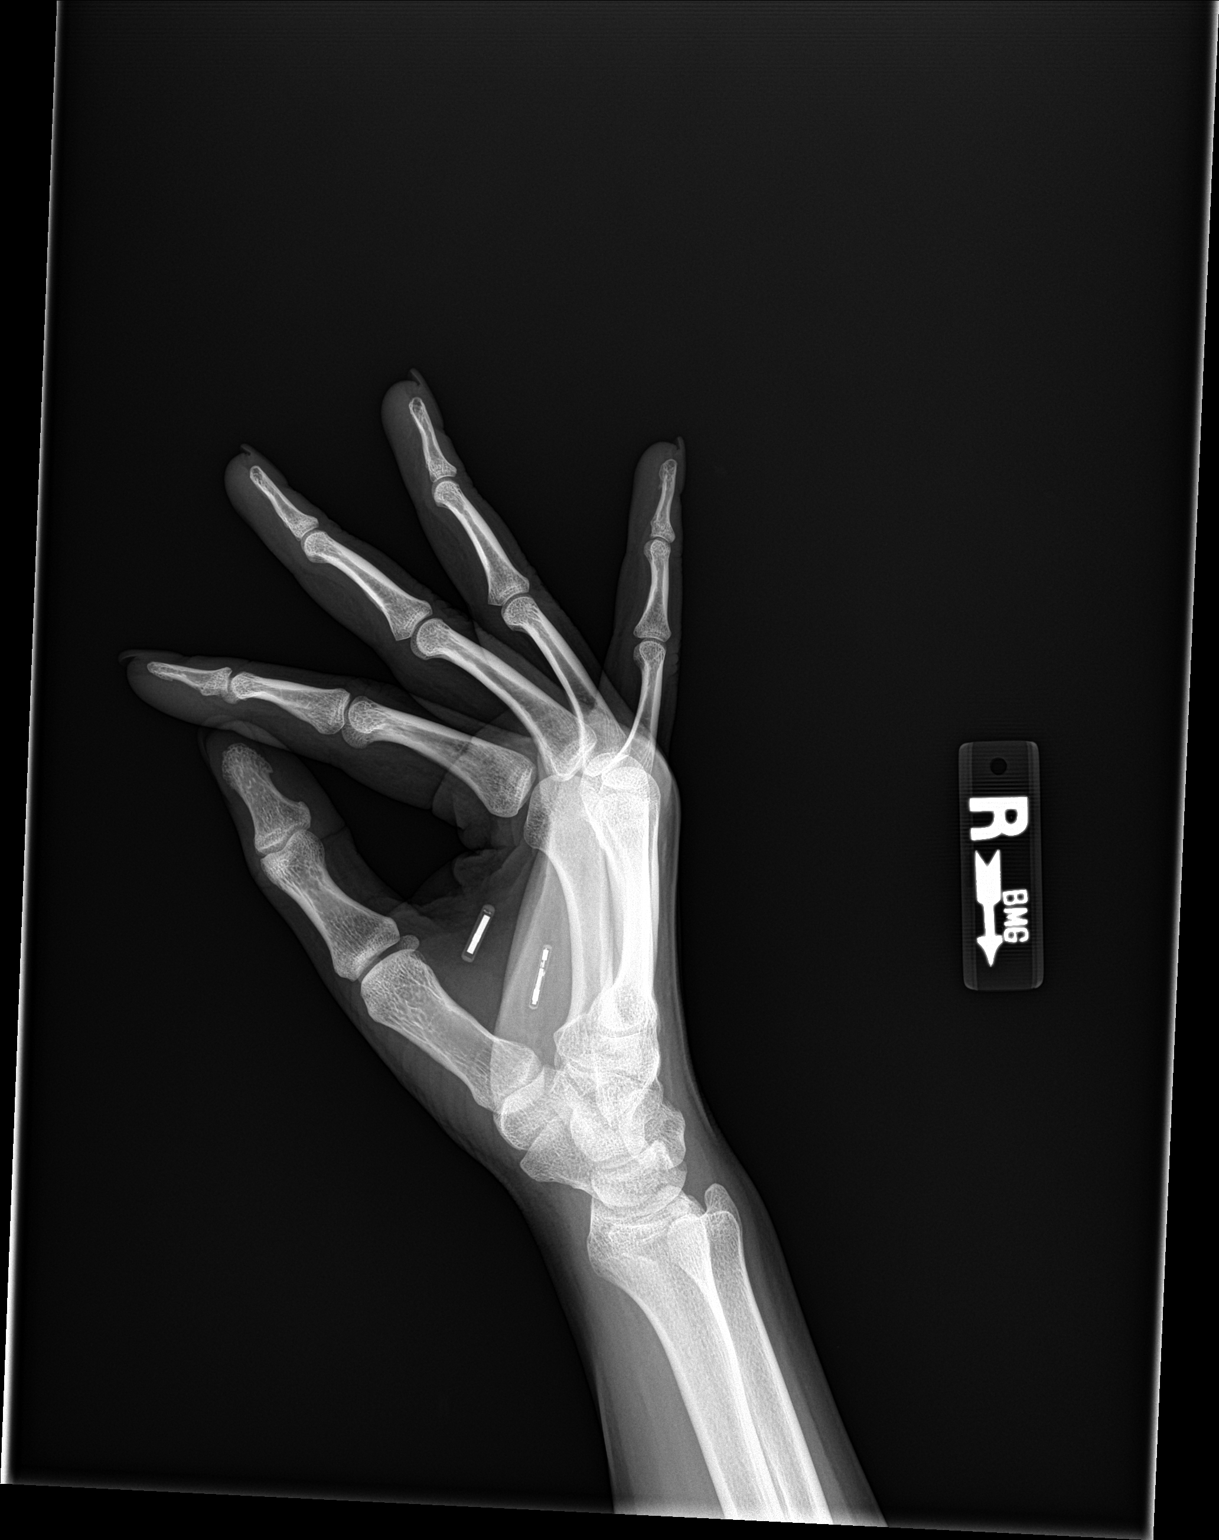

[3 of 3 positions shown; findings below may reference images not displayed]

FINDINGS: Frontal, oblique, and lateral views were obtained. There are 2
radiopaque foreign bodies in the soft tissues between the first and
second metacarpals. No fracture or dislocation. Joint spaces appear
normal. No erosive change.
IMPRESSION: Radiopaque foreign bodies located in the soft tissues between the
first and second metacarpals. No fracture or dislocation. No
appreciable arthropathy.

## 2021-03-15 DIAGNOSIS — F902 Attention-deficit hyperactivity disorder, combined type: Secondary | ICD-10-CM | POA: Diagnosis not present

## 2021-04-04 DIAGNOSIS — N4830 Priapism, unspecified: Secondary | ICD-10-CM | POA: Diagnosis not present

## 2021-04-04 DIAGNOSIS — E349 Endocrine disorder, unspecified: Secondary | ICD-10-CM | POA: Diagnosis not present

## 2021-04-04 DIAGNOSIS — Z1159 Encounter for screening for other viral diseases: Secondary | ICD-10-CM | POA: Diagnosis not present

## 2021-06-07 DIAGNOSIS — F902 Attention-deficit hyperactivity disorder, combined type: Secondary | ICD-10-CM | POA: Diagnosis not present

## 2021-09-06 DIAGNOSIS — F902 Attention-deficit hyperactivity disorder, combined type: Secondary | ICD-10-CM | POA: Diagnosis not present

## 2021-09-27 DIAGNOSIS — F902 Attention-deficit hyperactivity disorder, combined type: Secondary | ICD-10-CM | POA: Diagnosis not present

## 2021-12-20 DIAGNOSIS — F902 Attention-deficit hyperactivity disorder, combined type: Secondary | ICD-10-CM | POA: Diagnosis not present

## 2022-02-14 ENCOUNTER — Encounter: Payer: Self-pay | Admitting: Family Medicine

## 2022-02-14 ENCOUNTER — Ambulatory Visit (INDEPENDENT_AMBULATORY_CARE_PROVIDER_SITE_OTHER): Payer: BC Managed Care – PPO | Admitting: Family Medicine

## 2022-02-14 VITALS — BP 116/76 | HR 93 | Temp 98.4°F | Resp 16 | Ht 71.0 in | Wt 172.4 lb

## 2022-02-14 DIAGNOSIS — L918 Other hypertrophic disorders of the skin: Secondary | ICD-10-CM | POA: Diagnosis not present

## 2022-02-14 NOTE — Progress Notes (Signed)
   Subjective:    Patient ID: Andrea Sparks, adult    DOB: Sep 06, 1990, 31 y.o.   MRN: [id number]  HPI Skin lesion- L hip.  Noticed 2 days ago.  'it was never that big'  area is tender when directly 'poked'  Now inflamed- red.  'almost like there's a pimple under it'.   Review of Systems For ROS see HPI     Objective:   Physical Exam Vitals reviewed.  Constitutional:      General: She is not in acute distress.    Appearance: Normal appearance. She is not ill-appearing.  HENT:     Head: Normocephalic and atraumatic.  Skin:    General: Skin is warm and dry.     Findings: Erythema (1 cm skin tag on L posterior hip/upper buttock that is red and inflamed w/ small white head adjacent/under skin tag) present.  Neurological:     General: No focal deficit present.     Mental Status: She is alert and oriented to person, place, and time.  Psychiatric:        Mood and Affect: Mood normal.        Behavior: Behavior normal.        Thought Content: Thought content normal.           Assessment & Plan:  Inflamed skin tag- new.  Not painful.  No evidence of infxn.  Small white head directly adjacent or under skin tag.  No need for abx.  Encouraged hot compresses.  Instructed pt not to pick or squeeze.  Pt expressed understanding and is in agreement w/ plan.

## 2022-02-14 NOTE — Patient Instructions (Signed)
Follow up as needed or as scheduled This appears to be an inflamed skin tag w/ a pimple just beside/underneath Use hot compresses to draw out and whiteheads and help w/ swelling Try not to pick or poke Call with any questions or concerns Have a great weekend!!!

## 2022-03-14 DIAGNOSIS — F902 Attention-deficit hyperactivity disorder, combined type: Secondary | ICD-10-CM | POA: Diagnosis not present

## 2022-06-06 DIAGNOSIS — F902 Attention-deficit hyperactivity disorder, combined type: Secondary | ICD-10-CM | POA: Diagnosis not present

## 2022-07-02 DIAGNOSIS — Z1331 Encounter for screening for depression: Secondary | ICD-10-CM | POA: Diagnosis not present

## 2022-07-02 DIAGNOSIS — Z1389 Encounter for screening for other disorder: Secondary | ICD-10-CM | POA: Diagnosis not present

## 2022-07-02 DIAGNOSIS — R1084 Generalized abdominal pain: Secondary | ICD-10-CM | POA: Diagnosis not present

## 2022-07-02 DIAGNOSIS — Z0000 Encounter for general adult medical examination without abnormal findings: Secondary | ICD-10-CM | POA: Diagnosis not present

## 2022-07-02 DIAGNOSIS — E349 Endocrine disorder, unspecified: Secondary | ICD-10-CM | POA: Diagnosis not present

## 2022-08-29 DIAGNOSIS — F902 Attention-deficit hyperactivity disorder, combined type: Secondary | ICD-10-CM | POA: Diagnosis not present

## 2022-11-05 DIAGNOSIS — K047 Periapical abscess without sinus: Secondary | ICD-10-CM | POA: Diagnosis not present

## 2022-11-17 ENCOUNTER — Emergency Department (HOSPITAL_COMMUNITY)
Admission: EM | Admit: 2022-11-17 | Discharge: 2022-11-17 | Disposition: A | Discharge status: Home / Self Care | Payer: BC Managed Care – PPO | Attending: Emergency Medicine | Admitting: Emergency Medicine

## 2022-11-17 DIAGNOSIS — K047 Periapical abscess without sinus: Secondary | ICD-10-CM | POA: Diagnosis not present

## 2022-11-17 NOTE — ED Provider Notes (Addendum)
[place] EMERGENCY DEPARTMENT AT [facility] Provider Note   CSN: [id number] Arrival date & time: 11/17/22  2014     History Chief Complaint  Patient presents with   Dental Abscess    [name] is a 32 y.o. adult who presents to the emergency room today for evaluation of abscess to the roof of the mouth.  Patient reports that they were seeing their dentist were recently put on Augmentin and they were transitioned to clindamycin.  The dentist knew about the abscess.  While eating a pulled pork sandwich today, they report that they felt the abscess popped however were scared that it may have popped superiorly into the roof her mouth instead of inferiorly.  Did not have any exquisite pain or pressure.  Still does not have any exquisite pain or pressure.  They were concerned about going into the sinus cavity given the location of this.  HPI     Home Medications Prior to Admission medications   Medication Sig Start Date End Date Taking? Authorizing Provider  buPROPion (WELLBUTRIN XL) 150 MG 24 hr tablet TAKE 1 TABLET BY MOUTH ONCE DAILY IN THE MORNING 06/08/18   [provider]  dexmethylphenidate (FOCALIN XR) 15 MG 24 hr capsule Take 15 mg by mouth daily.    [provider]  estradiol valerate (DELESTROGEN) 20 MG/ML injection Inject into the muscle. 08/03/18 06/15/20  [provider]  methylphenidate (RITALIN) 10 MG tablet TAKE 1 TABLET BY MOUTH TWICE DAILY Patient not taking: Reported on 02/14/2022 06/04/18   [provider]  progesterone (PROMETRIUM) 100 MG capsule Take by mouth. 08/03/18 06/15/20  [provider]      Allergies    Patient has no known allergies.    Review of Systems   Review of Systems  Constitutional:  Negative for chills and fever.  HENT:  Positive for dental problem. Negative for sinus pressure, sinus pain and sore throat.     Physical Exam Updated Vital Signs BP (!) 140/77 (BP Location: Right Arm)    Pulse 87   Temp 98.1 F (36.7 C) (Oral)   Resp 18   SpO2 100%  Physical Exam Vitals and nursing note reviewed.  Constitutional:      General: She is not in acute distress.    Appearance: Normal appearance. She is not ill-appearing or toxic-appearing.     Comments: Friendly, talkative, does not appear to be in any acute distress.  HENT:     Head:     Comments: No tenderness to the maxillary or frontal sinuses.  No overlying skin changes.  No crepitus.  No facial swelling noted.    Mouth/Throat:     Comments: Poor dentition noted throughout.  Moist use membranes.  There is a small abscess seen on the right side of the upper palate.  Appears soft.  No fluctuance or significant induration to it as well.  Mild tenderness palpation.  I do not see any significant swelling.  Uvula midline.  Airway patent.  Moist mucous membranes.  No sublingual elevation. No trismus.  Eyes:     General: No scleral icterus. Pulmonary:     Effort: Pulmonary effort is normal. No respiratory distress.  Skin:    General: Skin is dry.  Neurological:     General: No focal deficit present.     Mental Status: She is alert. Mental status is at baseline.     ED Results / Procedures / Treatments   Labs (all labs ordered are  listed, but only abnormal results are displayed) Labs Reviewed - No data to display  EKG None  Radiology No results found.  Procedures Procedures   Medications Ordered in ED Medications - No data to display  ED Course/ Medical Decision Making/ A&P    Medical Decision Making  32 y.o. adult presents to the ER today for evaluation of dental abscess. Differential diagnosis includes but is not limited to sinus erosion, dental abscess, dental pain. Vital signs show slightly elevated blood pressure otherwise unremarkable. Physical exam as noted above.   The abscess appears to be slightly improved as the patient showed me pictures of it previously.  They do not have any tenderness to  palpation of the maxillary or frontal sinuses.  No trismus.  No pain with opening closing the jaw.  Very minimal tenderness palpation of the upper palate.  I do not appreciate any significant deviation or swelling.  Controlling secretions.  I had shared decision-making about CT of the maxillofacial for surveilling the abscess and to see if it went into the sinus cavities.  Patient reports that it is not painful, and does not think that they needed at this time.  I encouraged to come in at any time if they change her mind but otherwise I would follow-up with her dentist and continue taking her clindamycin as prescribed.  There is no crepitus upon palpation of the face.  No tenderness on palpation of the face per there is no facial swelling noted.  No trismus.  I feel like given the patient declined the CT, they are stable for discharge home.  We discussed plan at bedside. We discussed strict return precautions and red flag symptoms. The patient verbalized their understanding and agrees to the plan. The patient is stable and being discharged home in good condition.  Portions of this report may have been transcribed using voice recognition software. Every effort was made to ensure accuracy; however, inadvertent computerized transcription errors may be present.   I discussed this case with my attending physician who cosigned this note including patient's presenting symptoms, physical exam, and planned diagnostics and interventions. Attending physician stated agreement with plan or made changes to plan which were implemented.   Final Clinical Impression(s) / ED Diagnoses Final diagnoses:  Dental abscess    Rx / DC Orders ED Discharge Orders     None         Achille Rich, PA-C 11/17/22 2148    Achille Rich, PA-C 11/17/22 2148    Ernie Avena, MD 11/17/22 2208

## 2022-11-17 NOTE — ED Triage Notes (Signed)
Pt presents with known dental abscess.  Pt has just finished 10 day course of amoxicillin and started a course of clindamycin today.  Pt was eating a sandwich and noted that afterward the abscess had decreased in size.  Area appears to be soft and able to drain. Pt was concerned the pus had "traveled into her sinuses" since she didn't notice it draining into her mouth.

## 2022-11-17 NOTE — Discharge Instructions (Addendum)
You were seen in the ER today for evaluation of your dental abscess.  I am glad this is getting better.  Please continue taking clindamycin and follow-up with your dentist.  If you change your mind about the CT scan, please come back at any time for one.  If any concerns with new or worsening symptoms, please return to the nearest emergency department for evaluation.  Contact a doctor if: Your pain is worse and medicine does not help. Get help right away if: You have a fever or chills. Your symptoms suddenly get worse. You have a very bad headache. You have problems breathing or swallowing. You have trouble opening your mouth. You have swelling in your neck or close to your eye. These symptoms may be an emergency. Get help right away. Call your local emergency services (911 in the [place] Do not wait to see if the symptoms will go away. Do not drive yourself to the hospital.

## 2022-11-21 DIAGNOSIS — F902 Attention-deficit hyperactivity disorder, combined type: Secondary | ICD-10-CM | POA: Diagnosis not present

## 2023-02-06 DIAGNOSIS — F902 Attention-deficit hyperactivity disorder, combined type: Secondary | ICD-10-CM | POA: Diagnosis not present

## 2024-02-08 ENCOUNTER — Emergency Department (HOSPITAL_COMMUNITY)

## 2024-02-08 ENCOUNTER — Emergency Department (HOSPITAL_COMMUNITY): Admitting: Anesthesiology

## 2024-02-08 ENCOUNTER — Encounter (HOSPITAL_COMMUNITY)
Admission: EM | Disposition: A | Discharge status: Home / Self Care | Payer: Self-pay | Source: Home / Self Care | Attending: Emergency Medicine

## 2024-02-08 ENCOUNTER — Ambulatory Visit (HOSPITAL_COMMUNITY)
Admission: EM | Admit: 2024-02-08 | Discharge: 2024-02-08 | Disposition: A | Discharge status: Home / Self Care | Attending: Emergency Medicine | Admitting: Emergency Medicine

## 2024-02-08 ENCOUNTER — Other Ambulatory Visit: Payer: Self-pay

## 2024-02-08 ENCOUNTER — Encounter (HOSPITAL_COMMUNITY): Payer: Self-pay

## 2024-02-08 DIAGNOSIS — Z79899 Other long term (current) drug therapy: Secondary | ICD-10-CM | POA: Diagnosis not present

## 2024-02-08 DIAGNOSIS — F640 Transsexualism: Secondary | ICD-10-CM | POA: Insufficient documentation

## 2024-02-08 DIAGNOSIS — F419 Anxiety disorder, unspecified: Secondary | ICD-10-CM | POA: Insufficient documentation

## 2024-02-08 DIAGNOSIS — K819 Cholecystitis, unspecified: Secondary | ICD-10-CM

## 2024-02-08 DIAGNOSIS — K660 Peritoneal adhesions (postprocedural) (postinfection): Secondary | ICD-10-CM | POA: Diagnosis not present

## 2024-02-08 DIAGNOSIS — K8010 Calculus of gallbladder with chronic cholecystitis without obstruction: Secondary | ICD-10-CM | POA: Insufficient documentation

## 2024-02-08 DIAGNOSIS — K810 Acute cholecystitis: Secondary | ICD-10-CM | POA: Diagnosis present

## 2024-02-08 DIAGNOSIS — F32A Depression, unspecified: Secondary | ICD-10-CM | POA: Insufficient documentation

## 2024-02-08 HISTORY — DX: Depression, unspecified: F32.A

## 2024-02-08 HISTORY — PX: LAPAROSCOPIC CHOLECYSTECTOMY SINGLE PORT: SHX5891

## 2024-02-08 HISTORY — DX: Attention-deficit hyperactivity disorder, unspecified type: F90.9

## 2024-02-08 LAB — COMPREHENSIVE METABOLIC PANEL WITH GFR
ALT: 7 U/L (ref 0–44)
AST: 14 U/L — ABNORMAL LOW (ref 15–41)
Albumin: 4.3 g/dL (ref 3.5–5.0)
Alkaline Phosphatase: 36 U/L — ABNORMAL LOW (ref 38–126)
Anion gap: 12 (ref 5–15)
BUN: 11 mg/dL (ref 6–20)
CO2: 22 mmol/L (ref 22–32)
Calcium: 9.3 mg/dL (ref 8.9–10.3)
Chloride: 105 mmol/L (ref 98–111)
Creatinine, Ser: 0.71 mg/dL (ref 0.44–1.00)
GFR, Estimated: >60 mL/min (ref >60)
Glucose, Bld: 127 mg/dL — ABNORMAL HIGH (ref 70–99)
Potassium: 3.8 mmol/L (ref 3.5–5.1)
Sodium: 139 mmol/L (ref 135–145)
Total Bilirubin: 0.3 mg/dL (ref 0.0–1.2)
Total Protein: 7 g/dL (ref 6.5–8.1)

## 2024-02-08 LAB — CBC WITH DIFFERENTIAL/PLATELET
Abs Immature Granulocytes: 0.03 K/uL (ref 0.00–0.07)
Basophils Absolute: 0.1 K/uL (ref 0.0–0.1)
Basophils Relative: 1 %
Eosinophils Absolute: 0.1 K/uL (ref 0.0–0.5)
Eosinophils Relative: 1 %
HCT: 41.8 % (ref 36.0–46.0)
Hemoglobin: 13.4 g/dL (ref 12.0–15.0)
Immature Granulocytes: 0 %
Lymphocytes Relative: 27 %
Lymphs Abs: 2.9 K/uL (ref 0.7–4.0)
MCH: 28.9 pg (ref 26.0–34.0)
MCHC: 32.1 g/dL (ref 30.0–36.0)
MCV: 90.3 fL (ref 80.0–100.0)
Monocytes Absolute: 0.7 K/uL (ref 0.1–1.0)
Monocytes Relative: 7 %
Neutro Abs: 6.8 K/uL (ref 1.7–7.7)
Neutrophils Relative %: 64 %
Platelets: 293 K/uL (ref 150–400)
RBC: 4.63 MIL/uL (ref 3.87–5.11)
RDW: 12.2 % (ref 11.5–15.5)
WBC: 10.6 K/uL — ABNORMAL HIGH (ref 4.0–10.5)
nRBC: 0 % (ref 0.0–0.2)

## 2024-02-08 LAB — RESP PANEL BY RT-PCR (RSV, FLU A&B, COVID)  RVPGX2
Influenza A by PCR: NEGATIVE
Influenza B by PCR: NEGATIVE
Resp Syncytial Virus by PCR: NEGATIVE
SARS Coronavirus 2 by RT PCR: NEGATIVE

## 2024-02-08 LAB — LIPASE, BLOOD: Lipase: 20 U/L (ref 11–51)

## 2024-02-08 SURGERY — LAPAROSCOPIC CHOLECYSTECTOMY SINGLE SITE
Anesthesia: General

## 2024-02-08 MED ORDER — HYDROMORPHONE HCL 1 MG/ML IJ SOLN
INTRAMUSCULAR | Status: AC
  Filled 2024-02-08: qty 1

## 2024-02-08 MED ORDER — SODIUM CHLORIDE 0.9 % IV BOLUS
1000.0000 mL | Freq: Once | INTRAVENOUS | Status: AC
  Administered 2024-02-08: 1000 mL via INTRAVENOUS

## 2024-02-08 MED ORDER — PHENYLEPHRINE 80 MCG/ML (10ML) SYRINGE FOR IV PUSH (FOR BLOOD PRESSURE SUPPORT)
PREFILLED_SYRINGE | INTRAVENOUS | Status: DC | PRN
  Administered 2024-02-08: 160 ug via INTRAVENOUS
  Administered 2024-02-08: 80 ug via INTRAVENOUS

## 2024-02-08 MED ORDER — BUPIVACAINE-EPINEPHRINE 0.25% -1:200000 IJ SOLN
INTRAMUSCULAR | Status: DC | PRN
  Administered 2024-02-08: 30 mL

## 2024-02-08 MED ORDER — ACETAMINOPHEN 500 MG PO TABS
1000.0000 mg | ORAL_TABLET | ORAL | Status: AC
  Administered 2024-02-08: 1000 mg via ORAL
  Filled 2024-02-08: qty 2

## 2024-02-08 MED ORDER — LIDOCAINE HCL (CARDIAC) PF 100 MG/5ML IV SOSY
PREFILLED_SYRINGE | INTRAVENOUS | Status: DC | PRN
  Administered 2024-02-08: 100 mg via INTRAVENOUS

## 2024-02-08 MED ORDER — OXYCODONE HCL 5 MG PO TABS
5.0000 mg | ORAL_TABLET | Freq: Once | ORAL | Status: AC | PRN
  Administered 2024-02-08: 5 mg via ORAL

## 2024-02-08 MED ORDER — GABAPENTIN 300 MG PO CAPS
300.0000 mg | ORAL_CAPSULE | ORAL | Status: AC
  Administered 2024-02-08: 300 mg via ORAL
  Filled 2024-02-08: qty 1

## 2024-02-08 MED ORDER — KETOROLAC TROMETHAMINE 15 MG/ML IJ SOLN
15.0000 mg | Freq: Once | INTRAMUSCULAR | Status: AC
  Administered 2024-02-08: 15 mg via INTRAVENOUS
  Filled 2024-02-08: qty 1

## 2024-02-08 MED ORDER — ONDANSETRON HCL 4 MG/2ML IJ SOLN
INTRAMUSCULAR | Status: DC | PRN
  Administered 2024-02-08: 4 mg via INTRAVENOUS

## 2024-02-08 MED ORDER — PROPOFOL 10 MG/ML IV BOLUS
INTRAVENOUS | Status: AC
Start: 2024-02-08 — End: 2024-02-08
  Filled 2024-02-08: qty 20

## 2024-02-08 MED ORDER — LACTATED RINGERS IV SOLN: INTRAVENOUS | Status: DC

## 2024-02-08 MED ORDER — ORAL CARE MOUTH RINSE: 15.0000 mL | Freq: Once | OROMUCOSAL | Status: AC

## 2024-02-08 MED ORDER — OXYCODONE HCL 5 MG PO TABS
ORAL_TABLET | ORAL | Status: AC
  Filled 2024-02-08: qty 1

## 2024-02-08 MED ORDER — PROPOFOL 10 MG/ML IV BOLUS
INTRAVENOUS | Status: AC
  Filled 2024-02-08: qty 20

## 2024-02-08 MED ORDER — ONDANSETRON HCL 4 MG/2ML IJ SOLN: 4.0000 mg | Freq: Once | INTRAMUSCULAR | Status: DC | PRN

## 2024-02-08 MED ORDER — DEXAMETHASONE SODIUM PHOSPHATE 10 MG/ML IJ SOLN
INTRAMUSCULAR | Status: AC
  Filled 2024-02-08: qty 1

## 2024-02-08 MED ORDER — ONDANSETRON HCL 4 MG PO TABS: 4.0000 mg | ORAL_TABLET | Freq: Three times a day (TID) | ORAL | 5 refills | Status: AC | PRN

## 2024-02-08 MED ORDER — OXYCODONE HCL 5 MG/5ML PO SOLN: 5.0000 mg | Freq: Once | ORAL | Status: AC | PRN

## 2024-02-08 MED ORDER — 0.9 % SODIUM CHLORIDE (POUR BTL) OPTIME
TOPICAL | Status: DC | PRN
  Administered 2024-02-08: 1000 mL

## 2024-02-08 MED ORDER — CHLORHEXIDINE GLUCONATE CLOTH 2 % EX PADS: 6.0000 | MEDICATED_PAD | Freq: Once | CUTANEOUS | Status: DC

## 2024-02-08 MED ORDER — BUPIVACAINE-EPINEPHRINE (PF) 0.25% -1:200000 IJ SOLN
INTRAMUSCULAR | Status: AC
  Filled 2024-02-08: qty 30

## 2024-02-08 MED ORDER — DEXAMETHASONE SODIUM PHOSPHATE 10 MG/ML IJ SOLN
INTRAMUSCULAR | Status: DC | PRN
  Administered 2024-02-08: 10 mg via INTRAVENOUS

## 2024-02-08 MED ORDER — CELECOXIB 200 MG PO CAPS
200.0000 mg | ORAL_CAPSULE | ORAL | Status: AC
  Administered 2024-02-08: 200 mg via ORAL
  Filled 2024-02-08: qty 1

## 2024-02-08 MED ORDER — ENSURE PRE-SURGERY PO LIQD: 296.0000 mL | Freq: Once | ORAL | Status: DC

## 2024-02-08 MED ORDER — ROCURONIUM BROMIDE 100 MG/10ML IV SOLN
INTRAVENOUS | Status: DC | PRN
  Administered 2024-02-08: 20 mg via INTRAVENOUS
  Administered 2024-02-08: 60 mg via INTRAVENOUS

## 2024-02-08 MED ORDER — SUGAMMADEX SODIUM 200 MG/2ML IV SOLN
INTRAVENOUS | Status: DC | PRN
  Administered 2024-02-08: 200 mg via INTRAVENOUS

## 2024-02-08 MED ORDER — FENTANYL CITRATE (PF) 100 MCG/2ML IJ SOLN
INTRAMUSCULAR | Status: DC | PRN
  Administered 2024-02-08: 100 ug via INTRAVENOUS

## 2024-02-08 MED ORDER — FENTANYL CITRATE (PF) 100 MCG/2ML IJ SOLN
INTRAMUSCULAR | Status: AC
  Filled 2024-02-08: qty 2

## 2024-02-08 MED ORDER — KETOROLAC TROMETHAMINE 30 MG/ML IJ SOLN: 30.0000 mg | Freq: Once | INTRAMUSCULAR | Status: DC | PRN

## 2024-02-08 MED ORDER — MEPERIDINE HCL 100 MG/ML IJ SOLN: 6.2500 mg | INTRAMUSCULAR | Status: DC | PRN

## 2024-02-08 MED ORDER — METRONIDAZOLE 500 MG/100ML IV SOLN
500.0000 mg | Freq: Three times a day (TID) | INTRAVENOUS | Status: DC
  Administered 2024-02-08: 500 mg via INTRAVENOUS
  Filled 2024-02-08: qty 100

## 2024-02-08 MED ORDER — EPHEDRINE SULFATE-NACL 50-0.9 MG/10ML-% IV SOSY
PREFILLED_SYRINGE | INTRAVENOUS | Status: DC | PRN
  Administered 2024-02-08: 10 mg via INTRAVENOUS

## 2024-02-08 MED ORDER — SODIUM CHLORIDE 0.9 % IV SOLN
2.0000 g | Freq: Once | INTRAVENOUS | Status: AC
  Administered 2024-02-08: 2 g via INTRAVENOUS
  Filled 2024-02-08: qty 20

## 2024-02-08 MED ORDER — SODIUM CHLORIDE 0.9 % IV SOLN: 2.0000 g | INTRAVENOUS | Status: DC

## 2024-02-08 MED ORDER — AMISULPRIDE (ANTIEMETIC) 5 MG/2ML IV SOLN: 10.0000 mg | Freq: Once | INTRAVENOUS | Status: DC | PRN

## 2024-02-08 MED ORDER — PROPOFOL 10 MG/ML IV BOLUS
INTRAVENOUS | Status: DC | PRN
Start: 2024-02-08 — End: 2024-02-08
  Administered 2024-02-08: 160 mg via INTRAVENOUS

## 2024-02-08 MED ORDER — BUPIVACAINE LIPOSOME 1.3 % IJ SUSP
INTRAMUSCULAR | Status: AC
  Filled 2024-02-08: qty 20

## 2024-02-08 MED ORDER — MIDAZOLAM HCL 2 MG/2ML IJ SOLN
INTRAMUSCULAR | Status: AC
  Filled 2024-02-08: qty 2

## 2024-02-08 MED ORDER — ONDANSETRON HCL 4 MG/2ML IJ SOLN
4.0000 mg | Freq: Once | INTRAMUSCULAR | Status: AC
  Administered 2024-02-08: 4 mg via INTRAVENOUS
  Filled 2024-02-08: qty 2

## 2024-02-08 MED ORDER — STERILE WATER FOR IRRIGATION IR SOLN
Status: DC | PRN
  Administered 2024-02-08: 1000 mL

## 2024-02-08 MED ORDER — HYDROMORPHONE HCL 1 MG/ML IJ SOLN
0.2500 mg | INTRAMUSCULAR | Status: DC | PRN
  Administered 2024-02-08 (×2): 0.5 mg via INTRAVENOUS

## 2024-02-08 MED ORDER — PHENYLEPHRINE 80 MCG/ML (10ML) SYRINGE FOR IV PUSH (FOR BLOOD PRESSURE SUPPORT)
PREFILLED_SYRINGE | INTRAVENOUS | Status: AC
  Filled 2024-02-08: qty 10

## 2024-02-08 MED ORDER — MIDAZOLAM HCL 5 MG/5ML IJ SOLN
INTRAMUSCULAR | Status: DC | PRN
  Administered 2024-02-08: 2 mg via INTRAVENOUS

## 2024-02-08 MED ORDER — TRAMADOL HCL 50 MG PO TABS: 50.0000 mg | ORAL_TABLET | Freq: Four times a day (QID) | ORAL | 0 refills | Status: AC | PRN

## 2024-02-08 MED ORDER — ACETAMINOPHEN 500 MG PO TABS: 1000.0000 mg | ORAL_TABLET | Freq: Once | ORAL | Status: DC

## 2024-02-08 MED ORDER — CHLORHEXIDINE GLUCONATE 0.12 % MT SOLN
15.0000 mL | Freq: Once | OROMUCOSAL | Status: AC
  Administered 2024-02-08: 15 mL via OROMUCOSAL

## 2024-02-08 SURGICAL SUPPLY — 39 items
BAG COUNTER SPONGE SURGICOUNT (BAG) IMPLANT
CABLE HIGH FREQUENCY MONO STRZ (ELECTRODE) ×2 IMPLANT
CHLORAPREP W/TINT 26 (MISCELLANEOUS) ×1 IMPLANT
CLIP APPLIE 5 13 M/L LIGAMAX5 (MISCELLANEOUS) ×1 IMPLANT
COVER MAYO STAND STRL (DRAPES) ×1 IMPLANT
COVER SURGICAL LIGHT HANDLE (MISCELLANEOUS) ×1 IMPLANT
DRAIN CHANNEL 19F RND (DRAIN) IMPLANT
DRAPE C-ARM 42X120 X-RAY (DRAPES) ×2 IMPLANT
DRAPE WARM FLUID 44X44 (DRAPES) ×2 IMPLANT
DRSG TEGADERM 4X4.75 (GAUZE/BANDAGES/DRESSINGS) ×1 IMPLANT
ELECT REM PT RETURN 15FT ADLT (MISCELLANEOUS) ×1 IMPLANT
ENDOLOOP SUT PDS II 0 18 (SUTURE) IMPLANT
EVACUATOR SILICONE 100CC (DRAIN) IMPLANT
GAUZE SPONGE 2X2 8PLY STRL LF (GAUZE/BANDAGES/DRESSINGS) ×2 IMPLANT
GLOVE ECLIPSE 8.0 STRL XLNG CF (GLOVE) ×1 IMPLANT
GLOVE INDICATOR 8.0 STRL GRN (GLOVE) ×2 IMPLANT
GOWN STRL REUS W/ TWL XL LVL3 (GOWN DISPOSABLE) ×1 IMPLANT
IRRIGATION SUCT STRKRFLW 2 WTP (MISCELLANEOUS) ×1 IMPLANT
KIT BASIN OR (CUSTOM PROCEDURE TRAY) ×1 IMPLANT
KIT TURNOVER KIT A (KITS) ×2 IMPLANT
PAD POSITIONING PINK XL (MISCELLANEOUS) ×1 IMPLANT
PENCIL SMOKE EVACUATOR (MISCELLANEOUS) IMPLANT
POUCH RETRIEVAL ECOSAC 10 (ENDOMECHANICALS) IMPLANT
PROTECTOR NERVE ULNAR (MISCELLANEOUS) IMPLANT
SCISSORS LAP 5X35 DISP (ENDOMECHANICALS) ×1 IMPLANT
SET CHOLANGIOGRAPH MIX (MISCELLANEOUS) ×1 IMPLANT
SET TUBE SMOKE EVAC HIGH FLOW (TUBING) ×1 IMPLANT
SHEARS HARMONIC 36 ACE (MISCELLANEOUS) ×1 IMPLANT
SLEEVE Z-THREAD 5X100MM (TROCAR) ×1 IMPLANT
SPIKE FLUID TRANSFER (MISCELLANEOUS) ×1 IMPLANT
SUT MNCRL AB 4-0 PS2 18 (SUTURE) ×1 IMPLANT
SUT PDS AB 1 CT1 27 (SUTURE) ×4 IMPLANT
SUT PROLENE 2 0 CT 1 (SUTURE) IMPLANT
SUT PROLENE 2 0 CT2 30 (SUTURE) IMPLANT
SYR 20ML LL LF (SYRINGE) ×1 IMPLANT
TOWEL OR 17X26 10 PK STRL BLUE (TOWEL DISPOSABLE) ×1 IMPLANT
TRAY LAPAROSCOPIC (CUSTOM PROCEDURE TRAY) ×2 IMPLANT
TROCAR 5M 150ML BLDLS (TROCAR) ×2 IMPLANT
TROCAR Z-THREAD OPTICAL 5X100M (TROCAR) ×1 IMPLANT

## 2024-02-08 NOTE — Anesthesia Preprocedure Evaluation (Addendum)
 Anesthesia Evaluation  Patient identified by MRN, date of birth, ID band Patient awake    Reviewed: Allergy & Precautions, NPO status , Patient's Chart, lab work & pertinent test results  Airway Mallampati: II  TM Distance: >3 FB Neck ROM: Full    Dental  (+) Teeth Intact, Dental Advisory Given   Pulmonary neg pulmonary ROS   Pulmonary exam normal breath sounds clear to auscultation       Cardiovascular negative cardio ROS Normal cardiovascular exam Rhythm:Regular Rate:Normal     Neuro/Psych  PSYCHIATRIC DISORDERS  Depression    negative neurological ROS     GI/Hepatic negative GI ROS, Neg liver ROS,,,  Endo/Other  negative endocrine ROS    Renal/GU negative Renal ROS  negative genitourinary   Musculoskeletal negative musculoskeletal ROS (+)    Abdominal   Peds  Hematology negative hematology ROS (+)   Anesthesia Other Findings  transgender female with female reproductive organs   Reproductive/Obstetrics negative OB ROS                              Anesthesia Physical Anesthesia Plan  ASA: 1  Anesthesia Plan: General   Post-op Pain Management: Tylenol  PO (pre-op)*, Toradol  IV (intra-op)*, Precedex and Ketamine IV*   Induction: Intravenous  PONV Risk Score and Plan: 4 or greater and Ondansetron , Dexamethasone , Midazolam  and Treatment may vary due to age or medical condition  Airway Management Planned: Oral ETT  Additional Equipment: None  Intra-op Plan:   Post-operative Plan: Extubation in OR  Informed Consent: I have reviewed the patients History and Physical, chart, labs and discussed the procedure including the risks, benefits and alternatives for the proposed anesthesia with the patient or authorized representative who has indicated his/her understanding and acceptance.     Dental advisory given  Plan Discussed with: CRNA  Anesthesia Plan Comments:           Anesthesia Quick Evaluation

## 2024-02-08 NOTE — Transfer of Care (Signed)
 Immediate Anesthesia Transfer of Care Note  Patient: Andrea Sparks Number  Procedure(s) Performed: LAPAROSCOPIC CHOLECYSTECTOMY SINGLE SITE  Patient Location: PACU  Anesthesia Type:General  Level of Consciousness: awake, alert , and oriented  Airway & Oxygen Therapy: Patient Spontanous Breathing and Patient connected to face mask oxygen  Post-op Assessment: Report given to RN and Post -op Vital signs reviewed and stable  Post vital signs: Reviewed and stable  Last Vitals:  Vitals Value Taken Time  BP 136/78 02/08/24 12:52  Temp    Pulse 83 02/08/24 12:54  Resp 22 02/08/24 12:54  SpO2 100 % 02/08/24 12:54  Vitals shown include unfiled device data.  Last Pain:  Vitals:   02/08/24 0932  TempSrc: Oral  PainSc:       Patients Stated Pain Goal: 4 (02/08/24 0920)  Complications: No notable events documented.

## 2024-02-08 NOTE — ED Triage Notes (Signed)
 Right upper abdominal pain and vomiting that started at 0000 today. Denies diarrhea.

## 2024-02-08 NOTE — Discharge Instructions (Addendum)
 ################################################################  LAPAROSCOPIC SURGERY: POST OP INSTRUCTIONS  ######################################################################  EAT Gradually transition to a high fiber diet with a fiber supplement over the next few weeks after discharge.  Start with a pureed / full liquid diet (see below)  WALK Walk an hour a day.  Control your pain to do that.    CONTROL PAIN Control pain so that you can walk, sleep, tolerate sneezing/coughing, go up/down stairs.  HAVE A BOWEL MOVEMENT DAILY Keep your bowels regular to avoid problems.  OK to try a laxative to override constipation.  OK to use an antidairrheal to slow down diarrhea.  Call if not better after 2 tries  CALL IF YOU HAVE PROBLEMS/CONCERNS Call if you are still struggling despite following these instructions. Call if you have concerns not answered by these instructions  ######################################################################    DIET: Follow a light bland diet & liquids the first 24 hours after arrival home, such as soup, liquids, starches, etc.  Be sure to drink plenty of fluids.  Quickly advance to a usual solid diet within a few days.  Avoid fast food or heavy meals as your are more likely to get nauseated or have irregular bowels.  A low-fat, high-fiber diet for the rest of your life is ideal.  Take your usually prescribed home medications unless otherwise directed. Blood thinners:  You can restart any strong blood thinners after the second postoperative day  for example: COUMADIN (warfarin), XERELTO (rivaroxaban), ELIQUIS (apixaban), PLAVIX (clopidigrel), BRILINTA (ticagrelor), EFFIENT (prasugrel), PRADAXA (dabigatran), etc  Continue aspirin before & after surgery..     Some oozing/bleeding the first 1-2 weeks is common but should taper down & be small volume.    If you are passing many large clots or having uncontrolling bleeding, call your surgeon  PAIN  CONTROL: Pain is best controlled by a usual combination of three different methods TOGETHER: Ice/Heat Over the counter pain medication Prescription pain medication Most patients will experience some swelling and bruising around the incisions.  Ice packs or heating pads (30-60 minutes up to 6 times a day) will help. Use ice for the first few days to help decrease swelling and bruising, then switch to heat to help relax tight/sore spots and speed recovery.  Some people prefer to use ice alone, heat alone, alternating between ice & heat.  Experiment to what works for you.  Swelling and bruising can take several weeks to resolve.   It is helpful to take an over-the-counter pain medication regularly for the first few weeks.  Choose one of the following that works best for you: Naproxen (Aleve, etc)  Two 220mg  tabs twice a day Ibuprofen (Advil, etc) Three 200mg  tabs four times a day (every meal & bedtime) Acetaminophen  (Tylenol , etc) 500-650mg  four times a day (every meal & bedtime) A  prescription for pain medication (such as oxycodone , hydrocodone, tramadol , gabapentin , methocarbamol , etc) should be given to you upon discharge.  Take your pain medication as prescribed.  If you are having problems/concerns with the prescription medicine (does not control pain, nausea, vomiting, rash, itching, etc), please call us  [phone] to see if we need to switch you to a different pain medicine that will work better for you and/or control your side effect better. If you need a refill on your pain medication, please give us  48 hour notice.  contact your pharmacy.  They will contact our office to request authorization. Prescriptions will not be filled after 5 pm or on week-ends  AVOID GETTING CONSTIPATED.   a.  Between the surgery and the pain medications, it is common to experience some constipation.  b.  Drink plenty of liquids c   ake a fiber supplement 2 times day (such as Metamucil, Citrucel, FiberCon,  MiraLax, etc) to have a bowel movement every day. d.  If you have not had a BM by 2 days after surgery: -drink liquids only until you have a bowel movement - take MiraLAX 2 doses every 2 hours until you have a bowel movement   Watch out for diarrhea.   If you have many loose bowel movements, simplify your diet to bland foods & liquids for a few days.   Stop any stool softeners and decrease your fiber supplement.   Switching to mild anti-diarrheal medications (Kayopectate, Pepto Bismol) can help.   If this worsens or does not improve, please call us .  Wash / shower every day.  You may shower over the dressings as they are waterproof.  Continue to shower over incision(s) after the dressing is off.  It is good for closed incisions and even open wounds to be washed every day.  Shower every day.  Short baths are fine.  Wash the incisions and wounds clean with soap & water .    You may leave closed incisions open to air if it is dry.   You may cover the incision with clean gauze & replace it after your daily shower for comfort.  TEGADERM:  You have clear gauze band-aid dressings over your closed incision(s).  Remove the dressings 2 days after surgery = 9/17 Wednesday.    ACTIVITIES as tolerated:   You may resume regular (light) daily activities beginning the next day--such as daily self-care, walking, climbing stairs--gradually increasing activities as tolerated.  If you can walk 30 minutes without difficulty, it is safe to try more intense activity such as jogging, treadmill, bicycling, low-impact aerobics, swimming, etc. Save the most intensive and strenuous activity for last such as sit-ups, heavy lifting, contact sports, etc  Refrain from any heavy lifting or straining until you are off narcotics for pain control.   DO NOT PUSH THROUGH PAIN.  Let pain be your guide: If it hurts to do something, don't do it.  Pain is your body warning you to avoid that activity for another week until the pain goes  down. You may drive when you are no longer taking prescription pain medication, you can comfortably wear a seatbelt, and you can safely maneuver your car and apply brakes. You may have sexual intercourse when it is comfortable.  FOLLOW UP in our office Please call [facility] at [phone] to set up an appointment to see your surgeon in the office for a follow-up appointment approximately 2-3 weeks after your surgery. Make sure that you call for this appointment the day you arrive home to insure a convenient appointment time.  10. IF YOU HAVE DISABILITY OR FAMILY LEAVE FORMS, BRING THEM TO THE OFFICE FOR PROCESSING.  DO NOT GIVE THEM TO YOUR DOCTOR.   WHEN TO CALL US  ([phone]) [phone]: Poor pain control Reactions / problems with new medications (rash/itching, nausea, etc)  Fever over 101.5 F (38.5 C) Inability to urinate Nausea and/or vomiting Worsening swelling or bruising Continued bleeding from incision. Increased pain, redness, or drainage from the incision   The clinic staff is available to answer your questions during regular business hours (8:30am-5pm).  Please don't hesitate to call and ask to speak to one of our nurses for clinical concerns.   If you have  a medical emergency, go to the nearest emergency room or call 911.  A surgeon from [facility] is always on call at the [address] [facility], [place] [address], [place], [place]  [address] ? MAIN: [phone] ? TOLL FREE: [phone] ?  FAX [phone] [email or web address]  ##############################################################

## 2024-02-08 NOTE — ED Provider Notes (Signed)
 [facility] EMERGENCY DEPARTMENT AT [facility] Provider Note   CSN: [id number] Arrival date & time: 02/08/24  0354     Patient presents with: Abdominal Pain   Andrea Sparks is a 33 y.o. adult transgender female who still has a penis and testicles who presents with concern for upper abdominal pain that started around 11 PM with associated nausea and 3 episodes of NBNB emesis.  Pain is persistent, gnawing.  No change in bowel movements, no diarrhea, no urinary symptoms.  No fevers or chills or known ill contacts.  Patient continues to take estrogen once weekly injections.  Is on Wellbutrin for depression.   HPI     Prior to Admission medications   Medication Sig Start Date End Date Taking? Authorizing Provider  buPROPion (WELLBUTRIN XL) 150 MG 24 hr tablet TAKE 1 TABLET BY MOUTH ONCE DAILY IN THE MORNING 06/08/18   [provider]  dexmethylphenidate (FOCALIN XR) 15 MG 24 hr capsule Take 15 mg by mouth daily.    [provider]  estradiol  valerate (DELESTROGEN) 20 MG/ML injection Inject into the muscle. 08/03/18 06/15/20  [provider]  methylphenidate (RITALIN) 10 MG tablet TAKE 1 TABLET BY MOUTH TWICE DAILY Patient not taking: Reported on 02/14/2022 06/04/18   [provider]  progesterone (PROMETRIUM) 100 MG capsule Take by mouth. 08/03/18 06/15/20  [provider]    Allergies: Patient has no known allergies.    Review of Systems  Constitutional: Negative.   Gastrointestinal:  Positive for abdominal distention, nausea and vomiting. Negative for constipation and diarrhea.  Genitourinary: Negative.     Updated Vital Signs BP (!) 140/100   Pulse 80   Temp 98.1 F (36.7 C) (Oral)   Resp 20   Ht 6' 1 (1.854 m)   Wt 83.9 kg   SpO2 100%   BMI 24.41 kg/m   Physical Exam Vitals and nursing note reviewed.  Constitutional:      Appearance: She is not ill-appearing or toxic-appearing.  HENT:     Head: Normocephalic and  atraumatic.     Mouth/Throat:     Mouth: Mucous membranes are moist.     Pharynx: No oropharyngeal exudate or posterior oropharyngeal erythema.  Eyes:     General:        Right eye: No discharge.        Left eye: No discharge.     Conjunctiva/sclera: Conjunctivae normal.  Cardiovascular:     Rate and Rhythm: Normal rate and regular rhythm.     Pulses: Normal pulses.     Heart sounds: Normal heart sounds. No murmur heard. Pulmonary:     Effort: Pulmonary effort is normal. No respiratory distress.     Breath sounds: Normal breath sounds. No wheezing or rales.  Abdominal:     General: Bowel sounds are normal. There is no distension.     Palpations: Abdomen is soft.     Tenderness: There is abdominal tenderness in the right upper quadrant and epigastric area. There is no right CVA tenderness, left CVA tenderness, guarding or rebound. Positive signs include Murphy's sign.  Musculoskeletal:        General: No deformity.     Cervical back: Neck supple.     Right lower leg: No edema.     Left lower leg: No edema.  Skin:    General: Skin is warm and dry.     Capillary Refill: Capillary refill takes less than 2 seconds.  Neurological:  General: No focal deficit present.     Mental Status: She is alert and oriented to person, place, and time. Mental status is at baseline.  Psychiatric:        Mood and Affect: Mood normal.     (all labs ordered are listed, but only abnormal results are displayed) Labs Reviewed  CBC WITH DIFFERENTIAL/PLATELET - Abnormal; Notable for the following components:      Result Value   WBC 10.6 (*)    All other components within normal limits  COMPREHENSIVE METABOLIC PANEL WITH GFR - Abnormal; Notable for the following components:   Glucose, Bld 127 (*)    AST 14 (*)    Alkaline Phosphatase 36 (*)    All other components within normal limits  RESP PANEL BY RT-PCR (RSV, FLU A&B, COVID)  RVPGX2  LIPASE, BLOOD    EKG: None  Radiology: US  Abdomen  Limited RUQ (LIVER/GB) Result Date: 02/08/2024 EXAM: Right Upper Quadrant Abdominal Ultrasound TECHNIQUE: Real-time ultrasonography of the right upper quadrant of the abdomen was performed. COMPARISON: None. CLINICAL HISTORY: RUQ abdominal pain FINDINGS: LIVER: The liver demonstrates normal echogenicity. No intrahepatic biliary ductal dilatation. No mass. BILIARY SYSTEM: There is a 17 mm stone present within the neck of the gallbladder. There is abnormal thickening of the wall of the gallbladder, which measures up to 7 mm in thickness. There is biliary sludge present. The patient is not focally tender over the gallbladder. Common bile duct is within normal limits measuring 3 mm. RIGHT KIDNEY: The right kidney is grossly unremarkable in appearances without evidence of hydronephrosis, echogenic calculi or worrisome mass lesions. OTHER: No right upper quadrant ascites. IMPRESSION: 1. Stone in the neck of the gallbladder measuring approximately 17 mm, with associated abnormal wall thickening up to 7 mm and biliary sludge. Electronically signed by: Evalene Coho MD 02/08/2024 06:14 AM EDT RP Workstation: [id number]     Procedures   Medications Ordered in the ED  cefTRIAXone  (ROCEPHIN ) 2 g in sodium chloride  0.9 % 100 mL IVPB (has no administration in time range)  ondansetron  (ZOFRAN ) injection 4 mg (4 mg Intravenous Given 02/08/24 0534)  sodium chloride  0.9 % bolus 1,000 mL (1,000 mLs Intravenous New Bag/Given 02/08/24 0534)  ketorolac  (TORADOL ) 15 MG/ML injection 15 mg (15 mg Intravenous Given 02/08/24 0534)    Clinical Course as of 02/08/24 0646  Mon Feb 08, 2024  0626 Case discussed with Dr. Coho for clarification regarding presents of cholecystitis on right upper quadrant ultrasound.  He states that ultrasound is consistent with early cholecystitis.  Would recommend HIDA scan if clinically equivocal. I appreciate his collaboration in the care of this patient.  [RS]  [phone] Consult to Dr. Vanderbilt,  general surgery, who will admit the patient to his service. I appreciate his collaboration in the care of this patient.  [RS]    Clinical Course User Index [RS] Mireille Lacombe, Pleasant SAUNDERS, PA-C                                 Medical Decision Making 33 year old transgender female with female reproductive organs who presents with upper abdominal pain.  Hypertensive on intake vitals otherwise normal.  Cardiopulmonary unremarkable, abdominal exam is significant for positive Murphy sign on exam, right upper quadrant epigastric tender to palpation.  The differential diagnosis for RUQ includes but is not limited to:  Cholelithiasis / choledocholithiasis / cholecystitis / cholangitis, hepatitis (eg. viral, alcoholic, toxic),liver abscess, pancreatitis, liver /  pancreatic / biliary tract cancer, ischemic hepatopathy (shock liver), hepatic vein obstruction (Budd-Chiari syndrome), liver cell adenoma, peptic ulcer disease (duodenal), functional or nonulcer dyspepsia, right lower lobe pneumonia, pyelonephritis, urinary calculi,  Fitz-Hugh-Curtis syndrome (with pelvic inflammatory disease), herpes zoster, trauma or musculoskeletal pain, herniated disk, abdominal abscess, intestinal ischemia, physical or sexual abuse, appendicitis, UTI/renal colic, IBD.    Amount and/or Complexity of Data Reviewed Labs: ordered.    Details: CBC with leukocytosis of 10.6.  CMP reassuring. Lipase is normal.  Radiology: ordered.    Details: Right upper quadrant ultrasound with stone volume of the neck of the gallbladder and associate inflammatory changes consistent with cholecystitis.  Risk Prescription drug management. Decision regarding hospitalization.   Consult to surgeon as above who will add patient to their list this morning for in person evaluation and admission to's surgical service.  Antibiotics ordered.  Patient is resting comfortably in the bed at this time, remains with right upper quadrant tenderness palpation but  has declined any further analgesia at this time.  Verity is voiced understanding of the workup and treatment plan, and is amenable to plan for admission at this time.   This chart was dictated using voice recognition software, Dragon. Despite the best efforts of this provider to proofread and correct errors, errors may still occur which can change documentation meaning.     Final diagnoses:  Cholecystitis    ED Discharge Orders     None          Bobette Pleasant JONELLE DEVONNA 02/08/24 9353    Carita Senior, MD 02/09/24 [phone]

## 2024-02-08 NOTE — Anesthesia Postprocedure Evaluation (Signed)
 Anesthesia Post Note  Patient: Andrea Sparks  Procedure(s) Performed: LAPAROSCOPIC CHOLECYSTECTOMY SINGLE SITE     Patient location during evaluation: PACU Anesthesia Type: General Level of consciousness: awake Pain management: pain level controlled Vital Signs Assessment: post-procedure vital signs reviewed and stable Respiratory status: spontaneous breathing, nonlabored ventilation and respiratory function stable Cardiovascular status: blood pressure returned to baseline and stable Postop Assessment: no apparent nausea or vomiting Anesthetic complications: no   No notable events documented.  Last Vitals:  Vitals:   02/08/24 1345 02/08/24 1400  BP: (!) 141/96   Pulse: 64 80  Resp: 18 18  Temp: 36.7 C   SpO2: 100% 100%    Last Pain:  Vitals:   02/08/24 1400  TempSrc:   PainSc: 2                  Delon Aisha Arch

## 2024-02-08 NOTE — Op Note (Signed)
 02/08/2024  PATIENT:  Andrea Sparks  33 y.o. adult  Patient Care Team: Kip Ade, NP as PCP - General (Adult Health Nurse Practitioner)  PRE-OPERATIVE DIAGNOSIS:    Acute on Chronic Calculus Cholecystitis  POST-OPERATIVE DIAGNOSIS:   Acute on Chronic Calculus Cholecystitis  PROCEDURE:  SINGLE SITE Laparoscopic cholecystectomy (CPT code 52437)  SURGEON:  Elspeth KYM Schultze, MD, FACS.  ASSISTANT:  (n/a)   ANESTHESIA:  General endotracheal intubation anesthesia (GETA) and Local & regional field block at incision(s) for perioperative & postoperative pain control provided with 30mL of bupivicaine 0.25% with epinephrine   Estimated Blood Loss (EBL):   Total I/O In: 2100 [I.V.:1000; IV Piggyback:1100] Out: - .   (See anesthesia record)  Delay start of Pharmacological VTE agent (>24hrs) due to concerns of significant anemia, surgical blood loss, or risk of bleeding?:  no  DRAINS: (None)  SPECIMEN:  Gallbladder  DISPOSITION OF SPECIMEN:  Pathology  COUNTS:  Sponge, needle, & instrument counts CORRECT at the conclusion of the case.      PLAN OF CARE: Discharge to home after PACU  PATIENT DISPOSITION:  PACU - hemodynamically stable.  INDICATION: Young healthy female with episode of biliary colic and known gallstones.  Persistent attack that did not abate came to the ER.  Murphy sign with inflammation suspicious for cholecystitis.  Recommendation made for urgent cholecystectomy  The anatomy & physiology of hepatobiliary & pancreatic function was discussed.  The pathophysiology of gallbladder dysfunction was discussed.  Natural history risks without surgery was discussed.   I feel the risks of no intervention will lead to serious problems that outweigh the operative risks; therefore, I recommended cholecystectomy to remove the pathology.  I explained laparoscopic techniques with possible need for an open approach.  Probable cholangiogram to evaluate the bilary tract was explained as  well.    Risks such as bleeding, infection, abscess, leak, injury to other organs, need for further treatment, heart attack, death, and other risks were discussed.  I noted a good likelihood this will help address the problem.  Possibility that this will not correct all abdominal symptoms was explained.  Goals of post-operative recovery were discussed as well.  We will work to minimize complications.  An educational handout further explaining the pathology and treatment options was given as well.  Questions were answered.  The patient expresses understanding & wishes to proceed with surgery.  OR FINDINGS: Very enlarged turgid gallbladder with chronic thickening changes and some ischemia.  Edematous.  Consistent with acute on chronic cholecystitis.  Required decompression.  Moderately dense adhesions.  Very thin wispy cystic duct not amenable to cholangiogram.  Liver: normal  DESCRIPTION:   The patient was identified & brought in the operating room. The patient was positioned supine with arms tucked. SCDs were active during the entire case. The patient underwent general anesthesia without any difficulty.  The abdomen was prepped and draped in a sterile fashion. A Surgical Timeout confirmed our plan.  I made a transverse curvilinear incision through the superior umbilical fold.  I placed a 5mm long port through the supraumbilical fascia using a modified Hassan cutdown technique with umbilical stalk fascial countertraction. I began carbon dioxide insufflation.  No change in end tidal CO2 measurement.   Camera inspection revealed no injury. There were no adhesions to the anterior abdominal wall supraumbilically.  I proceeded to continue with single site technique. I placed a #5 port in left upper aspect of the wound. I placed a 5 mm atraumatic grasper in the right  inferior aspect of the wound.  I turned attention to the right upper quadrant.  Operative findings as noted above.  Patient had thick  adhesions of mesocolon and duodenum and omentum to the gallbladder and liver edge consistent with prior attacks.  Carefully freed off enough to expose the dome of the gallbladder.  The gallbladder fundus was elevated cephalad.  In grasping it I did get a small rupture with spillage of mildly purulent molasses thick bile.  Tried to needle aspirate to better decompress but it was still very thickened and irritated.  However gone up relaxation to better Grapey gallbladder.  I freed adhesions to the ventral surface of the gallbladder off carefully.  I freed the peritoneal coverings between the gallbladder and the liver on both the posteriolateral and anteriomedial walls. I alternated between Harmonic & blunt [place]  dissection to help get a good critical view of the cystic artery and cystic duct.  I did further dissection to free all of the gallbladder off the liver bed to get a good critical view of the infundibulum and cystic duct.  Eventually was able to succeed in a good dome down dissection.   I focused on the main biliary structures.  I dissected out the cystic artery; and, after getting a good 360 view, ligated the anterior & posterior branches of the cystic artery close on the infundibulum of the gallbladder using the Harmonic ultrasonic dissection and clips.  The cystic duct remaining was very thin and wispy.  I skeletonized the cystic duct and confirmed a good 360 degree critical view.  I placed a clip on the infundibulum.  It was far too narrow to tolerate a cholangiogram.  It was starting to avulse so I placed 4 clips on the cystic duct carefully.  I completed cystic duct transection.  Remaining stump was healthy and viable.  I did meticulous inspection around the cystic duct and arterial stumps and improved any more adhesions between the liver and common bile duct were wispy and  no other concerning structures.  Had good hemostasis and good inclusion with no leak of bile.    I ensured hemostasis on  the gallbladder fossa of the liver and elsewhere.  I placed the gallbladder inside an EcoSac.  I inspected the rest of the abdomen & detected no injury nor bleeding elsewhere.  I removed the EcoSac containing the gallbladder out of the supraumbilical fascia.  I had to open up the fascia to 27mm to safely get it out.  I closed the fascia transversely using #1 PDS interrupted stitches. I closed the skin using 4-0 monocryl stitch.  Sterile dressing was applied. The patient was extubated & arrived in the PACU in stable condition.  I had discussed postoperative care with the patient in the holding area. I discussed operative findings, updated the patient's status, discussed probable steps to recovery, and gave postoperative recommendations to the patient's mother, Powell Pepper.  Recommendations were made.  Questions were answered.  She expressed understanding & appreciation.  Elspeth KYM Schultze, M.D., F.A.C.S. Gastrointestinal and Minimally Invasive Surgery [facility] [address] [place], [place] [address] [phone] Main / Paging  02/08/2024 12:50 PM

## 2024-02-08 NOTE — Anesthesia Procedure Notes (Signed)
 Procedure Name: Intubation Date/Time: 02/08/2024 11:15 AM  Performed by: [place], Corean BROCKS, CRNAPre-anesthesia Checklist: Patient identified, Emergency Drugs available, Suction available and Patient being monitored Patient Re-evaluated:Patient Re-evaluated prior to induction Oxygen Delivery Method: Circle system utilized Preoxygenation: Pre-oxygenation with 100% oxygen Induction Type: IV induction Ventilation: Mask ventilation without difficulty Laryngoscope Size: Mac and 4 Grade View: Grade I Tube type: Oral Tube size: 7.5 mm Number of attempts: 1 Airway Equipment and Method: Stylet and Oral airway Placement Confirmation: ETT inserted through vocal cords under direct vision, positive ETCO2 and breath sounds checked- equal and bilateral Secured at: 21 cm Tube secured with: Tape Dental Injury: Teeth and Oropharynx as per pre-operative assessment

## 2024-02-08 NOTE — H&P (Signed)
 Andrea Sparks 05-19-1991  [id number].    Requesting MD: Bobette Herter PA-C Chief Complaint/Reason for Consult: cholecystitis   HPI:  Andrea Sparks is a 33 year old trans female with a past medical history of anxiety and depression who presents with right upper quadrant abdominal pain.  Patient states the pain started around midnight.  She ate [place] food at 4 PM and 7 PM prior to this.  Pain described as right upper abdominal pain that is sharp and burning in nature, with radiation to her back. Attempted 2 doses of Pepto-Bismol without relief of symptoms.  Self-induced vomiting without relief of symptoms.  Reports similar, less severe pain, over the last 2 weeks.  Reports this is her third episode of this pain this week.  The patient denies a history of abdominal surgery.  No known drug allergies.  Vapes, but denies cigarette use.  Denies daily alcohol use.  The patient works for [employer], a Health and safety inspector job.  The patient's wife is at the bedside.  The patient also adds that her mother worked as a TEFL teacher for many years with the Risk manager at [employer].  ROS: Review of Systems  All other systems reviewed and are negative.   History reviewed. No pertinent family history.  Past Medical History:  Diagnosis Date   ADHD    Depression     History reviewed. No pertinent surgical history.  Social History:  reports that she has never smoked. She has never used smokeless tobacco. She reports that she does not drink alcohol and does not use drugs.  Allergies: No Known Allergies  (Not in a hospital admission)    Physical Exam: Blood pressure 122/80, pulse 66, temperature 97.9 F (36.6 C), temperature source Oral, resp. rate 18, height 6' 1 (1.854 m), weight 83.9 kg, SpO2 100%. General: Pleasant laying on hospital bed, appears stated age, NAD. HEENT: head -normocephalic, atraumatic; Eyes: PERRLA, no conjunctival injection, anicteric sclerae Neck- Trachea is midline, no  thyromegaly or JVD appreciated.  CV- RRR, normal S1/S2, no M/R/G, no lower extremity edema Pulm- breathing is non-labored ORA Abd- soft, NT/ND, appropriate bowel sounds in 4 quadrants, no masses, hernias, or organomegaly. GU- deferred  MSK- UE/LE symmetrical, no cyanosis, clubbing, or edema. Neuro- CN II-XII grossly in tact, no paresthesias. Psych- Alert and Oriented x3 with appropriate affect Skin: warm and dry, no rashes or lesions   Results for orders placed or performed during the hospital encounter of 02/08/24 (from the past 48 hours)  CBC with Differential     Status: Abnormal   Collection Time: 02/08/24  4:27 AM  Result Value Ref Range   WBC 10.6 (H) 4.0 - 10.5 K/uL   RBC 4.63 3.87 - 5.11 MIL/uL   Hemoglobin 13.4 12.0 - 15.0 g/dL   HCT 58.1 63.9 - 53.9 %   MCV 90.3 80.0 - 100.0 fL   MCH 28.9 26.0 - 34.0 pg   MCHC 32.1 30.0 - 36.0 g/dL   RDW 87.7 88.4 - 84.4 %   Platelets 293 150 - 400 K/uL   nRBC 0.0 0.0 - 0.2 %   Neutrophils Relative % 64 %   Neutro Abs 6.8 1.7 - 7.7 K/uL   Lymphocytes Relative 27 %   Lymphs Abs 2.9 0.7 - 4.0 K/uL   Monocytes Relative 7 %   Monocytes Absolute 0.7 0.1 - 1.0 K/uL   Eosinophils Relative 1 %   Eosinophils Absolute 0.1 0.0 - 0.5 K/uL   Basophils Relative 1 %  Basophils Absolute 0.1 0.0 - 0.1 K/uL   Immature Granulocytes 0 %   Abs Immature Granulocytes 0.03 0.00 - 0.07 K/uL    Comment: Performed at [facility], [address], [place], [place] [address]  Comprehensive metabolic panel     Status: Abnormal   Collection Time: 02/08/24  4:27 AM  Result Value Ref Range   Sodium 139 135 - 145 mmol/L   Potassium 3.8 3.5 - 5.1 mmol/L   Chloride 105 98 - 111 mmol/L   CO2 22 22 - 32 mmol/L   Glucose, Bld 127 (H) 70 - 99 mg/dL    Comment: Glucose reference range applies only to samples taken after fasting for at least 8 hours.   BUN 11 6 - 20 mg/dL   Creatinine, Ser 9.28 0.44 - 1.00 mg/dL   Calcium 9.3 8.9 - 89.6 mg/dL    Total Protein 7.0 6.5 - 8.1 g/dL   Albumin 4.3 3.5 - 5.0 g/dL   AST 14 (L) 15 - 41 U/L   ALT 7 0 - 44 U/L   Alkaline Phosphatase 36 (L) 38 - 126 U/L   Total Bilirubin 0.3 0.0 - 1.2 mg/dL   GFR, Estimated >39 >39 mL/min    Comment: (NOTE) Calculated using the CKD-EPI Creatinine Equation (2021)    Anion gap 12 5 - 15    Comment: Performed at [facility], [address], [place], [place] [address]  Lipase, blood     Status: None   Collection Time: 02/08/24  4:27 AM  Result Value Ref Range   Lipase 20 11 - 51 U/L    Comment: Performed at [facility], [address] [place], [place] [address]  Resp panel by RT-PCR (RSV, Flu A&B, Covid) Anterior Nasal Swab     Status: None   Collection Time: 02/08/24  7:53 AM   Specimen: Anterior Nasal Swab  Result Value Ref Range   SARS Coronavirus 2 by RT PCR NEGATIVE NEGATIVE    Comment: (NOTE) SARS-CoV-2 target nucleic acids are NOT DETECTED.  The SARS-CoV-2 RNA is generally detectable in upper respiratory specimens during the acute phase of infection. The lowest concentration of SARS-CoV-2 viral copies this assay can detect is 138 copies/mL. A negative result does not preclude SARS-Cov-2 infection and should not be used as the sole basis for treatment or other patient management decisions. A negative result may occur with  improper specimen collection/handling, submission of specimen other than nasopharyngeal swab, presence of viral mutation(s) within the areas targeted by this assay, and inadequate number of viral copies(<138 copies/mL). A negative result must be combined with clinical observations, patient history, and epidemiological information. The expected result is Negative.  Fact Sheet for Patients:  BloggerCourse.com  Fact Sheet for Healthcare Providers:  SeriousBroker.it  This test is no t yet approved or cleared by the [place]  FDA and   has been authorized for detection and/or diagnosis of SARS-CoV-2 by FDA under an Emergency Use Authorization (EUA). This EUA will remain  in effect (meaning this test can be used) for the duration of the COVID-19 declaration under Section 564(b)(1) of the Act, 21 U.S.C.section 360bbb-3(b)(1), unless the authorization is terminated  or revoked sooner.       Influenza A by PCR NEGATIVE NEGATIVE   Influenza B by PCR NEGATIVE NEGATIVE    Comment: (NOTE) The Xpert Xpress SARS-CoV-2/FLU/RSV plus assay is intended as an aid in the diagnosis of influenza from Nasopharyngeal swab specimens and should not be  used as a sole basis for treatment. Nasal washings and aspirates are unacceptable for Xpert Xpress SARS-CoV-2/FLU/RSV testing.  Fact Sheet for Patients: BloggerCourse.com  Fact Sheet for Healthcare Providers: SeriousBroker.it  This test is not yet approved or cleared by the [place]  FDA and has been authorized for detection and/or diagnosis of SARS-CoV-2 by FDA under an Emergency Use Authorization (EUA). This EUA will remain in effect (meaning this test can be used) for the duration of the COVID-19 declaration under Section 564(b)(1) of the Act, 21 U.S.C. section 360bbb-3(b)(1), unless the authorization is terminated or revoked.     Resp Syncytial Virus by PCR NEGATIVE NEGATIVE    Comment: (NOTE) Fact Sheet for Patients: BloggerCourse.com  Fact Sheet for Healthcare Providers: SeriousBroker.it  This test is not yet approved or cleared by the [place]  FDA and has been authorized for detection and/or diagnosis of SARS-CoV-2 by FDA under an Emergency Use Authorization (EUA). This EUA will remain in effect (meaning this test can be used) for the duration of the COVID-19 declaration under Section 564(b)(1) of the Act, 21 U.S.C. section 360bbb-3(b)(1), unless the  authorization is terminated or revoked.  Performed at [facility], [address] [place], [place] [address]    US  Abdomen Limited RUQ (LIVER/GB) Result Date: 02/08/2024 EXAM: Right Upper Quadrant Abdominal Ultrasound TECHNIQUE: Real-time ultrasonography of the right upper quadrant of the abdomen was performed. COMPARISON: None. CLINICAL HISTORY: RUQ abdominal pain FINDINGS: LIVER: The liver demonstrates normal echogenicity. No intrahepatic biliary ductal dilatation. No mass. BILIARY SYSTEM: There is a 17 mm stone present within the neck of the gallbladder. There is abnormal thickening of the wall of the gallbladder, which measures up to 7 mm in thickness. There is biliary sludge present. The patient is not focally tender over the gallbladder. Common bile duct is within normal limits measuring 3 mm. RIGHT KIDNEY: The right kidney is grossly unremarkable in appearances without evidence of hydronephrosis, echogenic calculi or worrisome mass lesions. OTHER: No right upper quadrant ascites. IMPRESSION: 1. Stone in the neck of the gallbladder measuring approximately 17 mm, with associated abnormal wall thickening up to 7 mm and biliary sludge. Electronically signed by: Evalene Coho MD 02/08/2024 06:14 AM EDT RP Workstation: [id number]      Assessment/Plan Symptomatic cholelithiasis, suspect early acute cholecystitis Patient's history, labs, imaging have all been personally reviewed.  Patient's history is classic for worsening biliary colic over the last 1 to 2 weeks.  WBC 10.7.  LFTs and lipase are unremarkable.  Right upper quadrant ultrasound confirms cholelithiasis with a stone in the neck of the gallbladder and gallbladder wall thickening of 7 mm.   The operative and non-operative management of symptomatic cholelithiasis/cholecystitis was discussed with the patient. Risks of surgery including bleeding, infection, damage to surrounding structures, conversion to open, drain  placement, need for additional procedures, prolonged hospital stay, as well as the risks of general anesthesia were discussed with the patient and she would like to proceed with surgery. Questions were welcomed and answered.    Anticipate possible discharge home from the PACU.  With plans to return to work in 1 week.  I reviewed nursing notes, ED provider notes, last 24 h vitals and pain scores, last 48 h intake and output, last 24 h labs and trends, and last 24 h imaging results.  Almarie GORMAN Pringle, [facility] 02/08/2024, 9:06 AM Please see Amion for pager number during day hours 7:00am-4:30pm or 7:00am -11:30am on weekends

## 2024-02-09 ENCOUNTER — Encounter (HOSPITAL_COMMUNITY): Payer: Self-pay | Admitting: Surgery

## 2024-02-10 LAB — SURGICAL PATHOLOGY
